# Patient Record
Sex: Male | Born: 1940 | Race: White | Hispanic: No | Marital: Married | Smoking: Light tobacco smoker
Health system: Southern US, Community
[De-identification: ages and names within clinical notes are randomized; demographics above are authoritative.]

## PROBLEM LIST (undated history)

## (undated) DIAGNOSIS — K219 Gastro-esophageal reflux disease without esophagitis: Secondary | ICD-10-CM

## (undated) DIAGNOSIS — IMO0002 Reserved for concepts with insufficient information to code with codable children: Secondary | ICD-10-CM

## (undated) DIAGNOSIS — H3320 Serous retinal detachment, unspecified eye: Secondary | ICD-10-CM

## (undated) DIAGNOSIS — K449 Diaphragmatic hernia without obstruction or gangrene: Secondary | ICD-10-CM

## (undated) DIAGNOSIS — I209 Angina pectoris, unspecified: Secondary | ICD-10-CM

## (undated) DIAGNOSIS — I714 Abdominal aortic aneurysm, without rupture, unspecified: Secondary | ICD-10-CM

## (undated) DIAGNOSIS — J449 Chronic obstructive pulmonary disease, unspecified: Secondary | ICD-10-CM

## (undated) DIAGNOSIS — IMO0001 Reserved for inherently not codable concepts without codable children: Secondary | ICD-10-CM

## (undated) DIAGNOSIS — Z951 Presence of aortocoronary bypass graft: Secondary | ICD-10-CM

## (undated) DIAGNOSIS — I2510 Atherosclerotic heart disease of native coronary artery without angina pectoris: Secondary | ICD-10-CM

## (undated) DIAGNOSIS — F329 Major depressive disorder, single episode, unspecified: Secondary | ICD-10-CM

## (undated) DIAGNOSIS — I509 Heart failure, unspecified: Secondary | ICD-10-CM

## (undated) DIAGNOSIS — F32A Depression, unspecified: Secondary | ICD-10-CM

## (undated) DIAGNOSIS — R911 Solitary pulmonary nodule: Secondary | ICD-10-CM

## (undated) DIAGNOSIS — E785 Hyperlipidemia, unspecified: Secondary | ICD-10-CM

## (undated) DIAGNOSIS — C801 Malignant (primary) neoplasm, unspecified: Secondary | ICD-10-CM

## (undated) DIAGNOSIS — H269 Unspecified cataract: Secondary | ICD-10-CM

## (undated) DIAGNOSIS — I10 Essential (primary) hypertension: Secondary | ICD-10-CM

## (undated) DIAGNOSIS — I9789 Other postprocedural complications and disorders of the circulatory system, not elsewhere classified: Secondary | ICD-10-CM

## (undated) DIAGNOSIS — I4891 Unspecified atrial fibrillation: Secondary | ICD-10-CM

## (undated) DIAGNOSIS — M1990 Unspecified osteoarthritis, unspecified site: Secondary | ICD-10-CM

## (undated) HISTORY — DX: Unspecified atrial fibrillation: I48.91

## (undated) HISTORY — PX: NOSE SURGERY: SHX723

## (undated) HISTORY — DX: Abdominal aortic aneurysm, without rupture: I71.4

## (undated) HISTORY — DX: Serous retinal detachment, unspecified eye: H33.20

## (undated) HISTORY — DX: Chronic obstructive pulmonary disease, unspecified: J44.9

## (undated) HISTORY — DX: Reserved for concepts with insufficient information to code with codable children: IMO0002

## (undated) HISTORY — PX: EYE SURGERY: SHX253

## (undated) HISTORY — DX: Reserved for inherently not codable concepts without codable children: IMO0001

## (undated) HISTORY — DX: Presence of aortocoronary bypass graft: Z95.1

## (undated) HISTORY — DX: Atherosclerotic heart disease of native coronary artery without angina pectoris: I25.10

## (undated) HISTORY — DX: Hyperlipidemia, unspecified: E78.5

## (undated) HISTORY — DX: Solitary pulmonary nodule: R91.1

## (undated) HISTORY — DX: Abdominal aortic aneurysm, without rupture, unspecified: I71.40

## (undated) HISTORY — DX: Diaphragmatic hernia without obstruction or gangrene: K44.9

## (undated) HISTORY — DX: Unspecified cataract: H26.9

## (undated) HISTORY — DX: Gastro-esophageal reflux disease without esophagitis: K21.9

## (undated) HISTORY — DX: Unspecified osteoarthritis, unspecified site: M19.90

## (undated) HISTORY — DX: Other postprocedural complications and disorders of the circulatory system, not elsewhere classified: I97.89

## (undated) HISTORY — DX: Essential (primary) hypertension: I10

---

## 1962-06-20 HISTORY — PX: LUNG SURGERY: SHX703

## 1990-06-20 HISTORY — PX: CORONARY ANGIOPLASTY: SHX604

## 2002-10-15 ENCOUNTER — Other Ambulatory Visit: Admission: RE | Admit: 2002-10-15 | Discharge: 2002-10-15 | Payer: Self-pay | Admitting: Dermatology

## 2002-10-29 ENCOUNTER — Other Ambulatory Visit: Admission: RE | Admit: 2002-10-29 | Discharge: 2002-10-29 | Payer: Self-pay | Admitting: Dermatology

## 2006-05-24 ENCOUNTER — Ambulatory Visit (HOSPITAL_COMMUNITY): Admission: RE | Admit: 2006-05-24 | Discharge: 2006-05-24 | Payer: Self-pay | Admitting: Ophthalmology

## 2006-06-06 ENCOUNTER — Ambulatory Visit (HOSPITAL_COMMUNITY): Admission: RE | Admit: 2006-06-06 | Discharge: 2006-06-07 | Payer: Self-pay | Admitting: Ophthalmology

## 2010-08-20 ENCOUNTER — Encounter (INDEPENDENT_AMBULATORY_CARE_PROVIDER_SITE_OTHER): Payer: Medicare Other | Admitting: Vascular Surgery

## 2010-08-20 ENCOUNTER — Other Ambulatory Visit (HOSPITAL_COMMUNITY): Payer: Self-pay | Admitting: Internal Medicine

## 2010-08-20 DIAGNOSIS — R911 Solitary pulmonary nodule: Secondary | ICD-10-CM

## 2010-08-20 DIAGNOSIS — I714 Abdominal aortic aneurysm, without rupture: Secondary | ICD-10-CM

## 2010-08-23 NOTE — Assessment & Plan Note (Signed)
OFFICE VISIT  DELMON, ANDRADA DOB:  [birth year]                                       08/20/2010 UXNAT#:[id number]  This is a new patient consultation from Doreen Beam, MD.  REASON FOR CONSULTATION:  Aortic aneurysm.  HISTORY OF PRESENT ILLNESS:  This is a 70 year old gentleman who presents with chief complaint of abdominal aortic aneurysm.  Apparently he had sustained a rectus hematoma for unknown reason and was having severe pain and as part of that workup obtained a CTA that demonstrated an aneurysm.  He is completely asymptomatic from his aneurysm at this point.  No abdominal pain or back pain.  He has no family history of aneurysmal disease and no connective tissue disorder within his family. His risk factors for aneurysmal disease included age, male sex, COPD, general atherosclerotic disease, hypertension and smoking.  PAST MEDICAL HISTORY:  Includes coronary artery disease, COPD, nicotine dependent, hypertension, hyperlipidemic, traumatic injury to his right chest, bilateral cataracts, history of right retinal detachment.  PAST SURGICAL HISTORY:  Included a right lung resection related to his right chest traumatic injury, laser surgery to his eyes.  He has had a coronary stent placed.  He has also had some type of nasal surgery due to trauma to his nasal septum.  SOCIAL HISTORY:  He smokes 20 packs per day for a total about 100 pack year history.  Drinks socially.  Denies any illicit drug use.  FAMILY HISTORY:  Father had an MI at 8.  He had a brother that died at 5 with coronary artery disease.  Mother died of old age in the 68s.  He had a sister that had leukemia.  MEDICATIONS:  He is on atenolol, diltiazem, lovastatin, Prilosec, Celexa, aspirin, Nitrostat.  ALLERGIES:  Are penicillin.  REVIEW OF SYSTEMS:  He noted reflux, hiatal hernia and ulcer.  PHYSICAL EXAMINATION.:  Vital signs:  Blood pressure 144/88, pulse 78, respirations were  12. General:  Well-developed, well-nourished, no apparent distress, alert and oriented x3.  Head:  Normocephalic, atraumatic. ENT:  Hearing was decreased bilaterally.  His oropharynx had some erythema without any exudate.  Nares were without any erythema or drainage. Eyes:  Pupils were equal, round and reactive.  He had postsurgical changes to his pupils bilaterally.  Neck:  Supple without any obvious nuchal rigidity. Lungs:  Exam symmetric, good air movement.  No rales, rhonchi or wheezing. Cardiac:  Regular rate and rhythm.  Normal S1-S2.  No murmurs, rubs, thrills or gallops. Vascular:  He had palpable upper extremity pulses.  For the lower extremities, I felt his popliteals and I could feel palpable posterior tibial bilaterally but no dorsalis pedis and both feet were cool.  His abdominal aorta was not palpable due to his obesity.  He had no bruits in bilateral neck. Abdomen:  Soft.  Abdomen nontender, nondistended.  No guarding, no rebound, no hepatosplenomegaly.  No obvious masses. Musculoskeletal:  He had 5/5 strength in all extremities.  There were no obvious ischemic changes in any extremity. Neuro:  Cranial nerves II-XII were intact.  Motor was as listed above. Sensation was grossly intact. Psychiatric:  Judgment was intact.  Mood and affect were appropriate for his clinical situation. Skin:  He had multiple areas of skin damage on both his hands and face. His extremities were as listed above. Lymphatic:  He had no cervical, axillary or  inguinal lymphadenopathy.  MEDICAL DECISION MAKING:  This is a 70 year old gentleman with incidentally found abdominal aortic aneurysm.  He is asymptomatic at this point.  My plan is to proceed forward with an abdominal aortic duplex as the CTA is not able to produce an orthogonal measurement of the aneurysm without 3-D reconstructions which were not available on the outside disk.  I reviewed the outside disk to my eyes and he has  an aorta that I agree maximal measurement is about 4.1 cm.  He has an acceptable neck for an endograft and acceptable iliac vessels for endograft with good distal landing zone.  I think the first step would be to get a true measurement of the size of this aneurysm with an aortic duplex and also due to some missing pulses in his feet I am a bit concerned that he may have in fact some degree of peripheral arterial disease so I am going to order bilateral lower extremity ABIs to screen for such.  This gentleman based on his aortic size most likely will be on a q.year surveillance.  The criteria for proceeding with repair include 5 to 5.5 cm diameter in the aorta, growth of 0.5 cm per 6 months or symptomatic status.  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  In most cases combined aortic and common iliac artery repair is indicated.  Thank you for giving [place] the opportunity to participate in this patient's care.  We will obtain the studies and have him follow up in about 1-2 weeks.    Fransisco Hertz, MD Electronically Signed  BLC/MEDQ  D:  08/20/2010  T:  08/23/2010  Job:  [id number]  cc:   Doreen Beam, MD

## 2010-09-03 ENCOUNTER — Encounter (INDEPENDENT_AMBULATORY_CARE_PROVIDER_SITE_OTHER): Payer: Medicare Other

## 2010-09-03 ENCOUNTER — Ambulatory Visit (INDEPENDENT_AMBULATORY_CARE_PROVIDER_SITE_OTHER): Payer: Medicare Other | Admitting: Vascular Surgery

## 2010-09-03 DIAGNOSIS — I714 Abdominal aortic aneurysm, without rupture: Secondary | ICD-10-CM

## 2010-09-03 DIAGNOSIS — R0989 Other specified symptoms and signs involving the circulatory and respiratory systems: Secondary | ICD-10-CM

## 2010-09-07 ENCOUNTER — Encounter (HOSPITAL_COMMUNITY)
Admission: RE | Admit: 2010-09-07 | Discharge: 2010-09-07 | Disposition: A | Discharge status: Home / Self Care | Payer: Medicare Other | Source: Ambulatory Visit | Attending: Internal Medicine | Admitting: Internal Medicine

## 2010-09-07 ENCOUNTER — Encounter (HOSPITAL_COMMUNITY): Payer: Self-pay

## 2010-09-07 DIAGNOSIS — R911 Solitary pulmonary nodule: Secondary | ICD-10-CM

## 2010-09-07 DIAGNOSIS — J984 Other disorders of lung: Secondary | ICD-10-CM | POA: Insufficient documentation

## 2010-09-07 HISTORY — DX: Malignant (primary) neoplasm, unspecified: C80.1

## 2010-09-07 LAB — GLUCOSE, CAPILLARY: Glucose-Capillary: 107 mg/dL — ABNORMAL HIGH (ref 70–99)

## 2010-09-07 MED ORDER — FLUDEOXYGLUCOSE F - 18 (FDG) INJECTION
17.6000 | Freq: Once | INTRAVENOUS | Status: AC | PRN
  Administered 2010-09-07: 17.6 via INTRAVENOUS

## 2010-09-07 NOTE — Assessment & Plan Note (Signed)
OFFICE VISIT  Troy [name], Troy [name] DOB:  12/28/1940                                       09/03/2010 ZHYQM#:[id number]  This is an established patient.  HISTORY OF PRESENT ILLNESS:  This is a 70 year old gentleman I have been seeing for abdominal aortic aneurysm.  To set his baseline ultrasound surveillance I sent him for an ultrasound.  Also I had problems feeling his pedal pulses on a previous visit so I had ABIs completed.  At this point he has no back or abdominal complaints.  At this point he has successfully quit smoking.  His past medical history, past surgical history, social history, family history, review of systems, medications and allergies are all unchanged except for the smoking cessation.  PHYSICAL EXAMINATION:  Today he had a blood pressure of 110/73, heart rate 74, respirations were 12.  On focused physical examination I did not feel a pulsatile midline mass in the abdomen.  His feet were warm today.  I still had problems feeling his pedal pulses.  NONINVASIVE VASCULAR IMAGING:  He had first an abdominal aortic duplex that demonstrates a distal AAA of 4.5 cm x 4.6 cm.  His right iliac was 1.9 and his left was 1.6.  He also had a second study of ABIs.  Right ABI was 1.18, on the left 1.16.  Pedals bilaterally were biphasic.  MEDICAL DECISION MAKING:  This is a 70 year old gentleman with a 4.6 cm abdominal aortic aneurysm.  It does not meet the repair criteria.  The differences in sizes could be accounted by the differences in the imaging modalities, CT versus ultrasound.  There is also a change in the size of the right common iliac artery aneurysm down to 1.9 cm from previous 2.1 cm.  The same criteria for repair are as previous: symptomatic status, greater than 5 cm to 5.5 cm size symptomatic aneurysm, or increase in size greater than 0.5 cm per 6 month period. The patient also has no evidence of peripheral arterial disease based on the  ABIs and waveform analysis.  He successfully quit smoking at this point which is the #1 risk factor for aneurysmal growth.  At this point this patient is on q.6 month interval for surveillance of his abdominal aortic aneurysm and will continue to follow until he meets criteria, if he ever meets the criteria for repair.    Fransisco Hertz, MD Electronically Signed  BLC/MEDQ  D:  09/03/2010  T:  09/06/2010  Job:  [id number]  cc:   Doreen Beam, MD

## 2010-09-14 NOTE — Procedures (Unsigned)
DUPLEX ULTRASOUND OF ABDOMINAL AORTA  INDICATION:  Abdominal aortic aneurysm and right common iliac artery aneurysm.  HISTORY: Diabetes:  No. Cardiac:  No. Hypertension:  Yes. Smoking:  Yes. Connective Tissue Disorder: Family History:  No. Previous Surgery:  No.  DUPLEX EXAM:         AP (cm)                   TRANSVERSE (cm) Proximal             2.9 cm                    3.0 cm Mid                  2.5 cm                    3.6 cm Distal               4.5 cm                    4.6 cm Right Iliac          1.9 cm                    1.9 cm Left Iliac           1.5 cm                    1.6 cm  PREVIOUS:  Date: 08/11/2010  (CT)  AP:  TRANSVERSE:  4.1  IMPRESSION: 1. Aneurysmal dilatation of the distal abdominal aorta with a maximum     diameter measuring slightly larger than the previous CT. 2. Maximum diameter of the right common iliac artery of 1.9 cm appears     stable when compared to the previous CT in which it measured 2.1     cm.  ___________________________________________ Fransisco Hertz, MD  CH/MEDQ  D:  09/07/2010  T:  09/07/2010  Job:  [id number]

## 2011-02-03 ENCOUNTER — Encounter: Payer: Self-pay | Admitting: Vascular Surgery

## 2011-02-25 ENCOUNTER — Encounter (INDEPENDENT_AMBULATORY_CARE_PROVIDER_SITE_OTHER): Payer: Medicare Other | Admitting: *Deleted

## 2011-02-25 ENCOUNTER — Ambulatory Visit (INDEPENDENT_AMBULATORY_CARE_PROVIDER_SITE_OTHER): Payer: Medicare Other | Admitting: Vascular Surgery

## 2011-02-25 ENCOUNTER — Encounter: Payer: Self-pay | Admitting: Vascular Surgery

## 2011-02-25 VITALS — BP 131/84 | HR 63 | Ht 71.0 in | Wt 178.0 lb

## 2011-02-25 DIAGNOSIS — I714 Abdominal aortic aneurysm, without rupture, unspecified: Secondary | ICD-10-CM

## 2011-02-25 NOTE — Progress Notes (Signed)
[facility]  Established Abdominal Aortic Aneurysm  History of Present Illness  The patient is a 70 y.o. male who presents with chief complaint: follow up for AAA.  Previous studies demonstrate an AAA, measuring 4.5 cm x 4.6  cm.  The patient does not have back or abdominal pain.    The patient's PMH, PSH, SH, FamHx, Med, Allergies and ROS are unchanged from 09/03/10 except for on review of systems he notes: chest/neck pain which resolves with NTG and sitting up.  He also notes occasionally smoking a few cigarettes..  Physical Examination  Filed Vitals:   02/25/11 0944  BP: 131/84  Pulse: 63   General: A&O x 3, WDWN  Pulmonary: Sym exp, good air movt, CTAB, no rales, rhonchi, & wheezing  Cardiac: RRR, Nl S1, S2, no Murmurs, rubs or gallops  Vascular: Vessel Right Left  Radial Palpable Palpable  Brachial Palpable Palpable  Carotid Palpable, without bruit Palpable, without bruit  Aorta Non-palpable N/A  Femoral Palpable Palpable  Popliteal Non-palpable Non-palpable  PT Palpable Palpable  DP Non-Palpable Non-Palpable   Gastrointestinal: soft, NTND, -G/R, - HSM, - masses, - CVAT B, no palpable pulsatile mass, mild obesity  Musculoskeletal: M/S 5/5 throughout , Extremities without ischemic changes   Neurologic: Pain and light touch intact in extremities , Motor exam as listed above  Non-Invasive Vascular Imaging  AAA Duplex (Date: 02/25/11)  Previous size: 4.5 cm x 4.6 cm (Date: 09/03/10)  Current size:  4.03 cm (difficult study due to tortuosity)  R iliac: 1.93 cm  L iliac: 1.28 cm  Medical Decision Making  The patient is a 70 y.o. male who presents with: asx AAA < 5.5 cm.   Based on this patient's exam and diagnostic studies, he needs q6 month aortic and aortoiliac duplexes.  The threshold for repair is AAA size > 5.5 cm, growth > 1 cm/yr, and symptomatic status.  He will follow up with me in 6 months, and if the ultrasound study is  equivocal we'll order a CTA of abd/pelvis at that time  I emphasized the importance of maximal medical management including strict control of blood pressure, blood glucose, and lipid levels, obtaining regular exercise, and cessation of smoking.    Thank you for allowing [place] to participate in this patient's care.  Leonides Sake, MD [facility] [place] Office: [phone] Pager: [phone]

## 2011-03-04 NOTE — Procedures (Unsigned)
DUPLEX ULTRASOUND OF ABDOMINAL AORTA  INDICATION:  Followup AAA and right CIA aneurysm.  HISTORY: Diabetes:  No. Cardiac:  No. Hypertension:  Yes. Smoking:  Yes. Connective Tissue Disorder: Family History:  No. Previous Surgery:  No.  DUPLEX EXAM:         AP (cm)                   TRANSVERSE (cm) Proximal             2.12 cm                   cm Mid                  4.03 cm                   cm Distal               2.85 cm                   2.71 cm Right Iliac          1.93 cm                   cm Left Iliac           1.28 cm                   cm  PREVIOUS:  Date:  09/03/2010  AP:  4.5  TRANSVERSE:  4.6  IMPRESSION: 1. Abdominal aortic aneurysm appears irregular and the aorta is     somewhat tortuous which makes accurate measurement difficult,     however, today's measurement of mid aorta is 4.03 cm. 2. Right iliac is stable measuring approximately 1.9 cm.  ___________________________________________ Fransisco Hertz, MD  LT/MEDQ  D:  02/25/2011  T:  02/25/2011  Job:  [id number]

## 2011-03-11 ENCOUNTER — Ambulatory Visit: Payer: Medicare Other | Admitting: Vascular Surgery

## 2011-05-16 ENCOUNTER — Encounter: Payer: Self-pay | Admitting: Cardiology

## 2011-05-16 ENCOUNTER — Encounter: Payer: Self-pay | Admitting: *Deleted

## 2011-05-16 ENCOUNTER — Ambulatory Visit (INDEPENDENT_AMBULATORY_CARE_PROVIDER_SITE_OTHER): Payer: Medicare Other | Admitting: Cardiology

## 2011-05-16 DIAGNOSIS — I714 Abdominal aortic aneurysm, without rupture: Secondary | ICD-10-CM

## 2011-05-16 DIAGNOSIS — F329 Major depressive disorder, single episode, unspecified: Secondary | ICD-10-CM

## 2011-05-16 DIAGNOSIS — Z9889 Other specified postprocedural states: Secondary | ICD-10-CM

## 2011-05-16 DIAGNOSIS — Z01810 Encounter for preprocedural cardiovascular examination: Secondary | ICD-10-CM

## 2011-05-16 DIAGNOSIS — I2510 Atherosclerotic heart disease of native coronary artery without angina pectoris: Secondary | ICD-10-CM

## 2011-05-16 DIAGNOSIS — Z720 Tobacco use: Secondary | ICD-10-CM

## 2011-05-16 DIAGNOSIS — I452 Bifascicular block: Secondary | ICD-10-CM | POA: Insufficient documentation

## 2011-05-16 DIAGNOSIS — I10 Essential (primary) hypertension: Secondary | ICD-10-CM

## 2011-05-16 DIAGNOSIS — R079 Chest pain, unspecified: Secondary | ICD-10-CM

## 2011-05-16 MED ORDER — NITROGLYCERIN 0.4 MG SL SUBL: 0.4000 mg | SUBLINGUAL_TABLET | SUBLINGUAL | Status: DC | PRN

## 2011-05-16 MED ORDER — ISOSORBIDE MONONITRATE ER 30 MG PO TB24: 30.0000 mg | ORAL_TABLET | Freq: Every day | ORAL | Status: DC

## 2011-05-16 NOTE — Assessment & Plan Note (Signed)
Unclear if this is an old or new finding

## 2011-05-16 NOTE — Assessment & Plan Note (Signed)
Blood pressure well controlled

## 2011-05-16 NOTE — Assessment & Plan Note (Signed)
Patient is followed by vascular surgery and receives routine followup both for AAA and peripheral vascular disease.

## 2011-05-16 NOTE — Patient Instructions (Signed)
   JV Cath - see info sheet given  Begin Imdur 30mg  daily  Nitroglycerin refill also sent to pharmacy Follow up will be given at time of discharge from procedure above

## 2011-05-16 NOTE — Assessment & Plan Note (Signed)
Patient does receive routine followup with CT scans.

## 2011-05-16 NOTE — Assessment & Plan Note (Signed)
Patient symptoms are consistent with angina. He has known significant vascular disease and prior coronary intervention approximately 20 years ago. He also has mild inferior hypokinesis by echocardiogram. I recommended a diagnostic cardiac catheterization to be done from the arm. I discussed the risks and benefits of the procedure with the patient's as well as his wife. They are in agreement to proceed. The patient has no IVP dye allergy. His only allergies penicillin. In the meanwhile I also gave him a new prescription for sublingual nitroglycerin, as well as the addition of isosorbide mononitrate 30 minutes by mouth daily

## 2011-05-16 NOTE — Assessment & Plan Note (Signed)
I counseled the patient regarding tobacco use.

## 2011-05-16 NOTE — Progress Notes (Addendum)
CC: New Patient evaluation for chest pain  HPI:  The patient is a 70-year-old male with a history of coronary artery disease status post angioplasty 20 years ago at [facility]. The patient more recently is followed by vascular surgery for a AAA which has not reached surgical dimensions yet. [4.5 x 4.6 cm]. The patient is also evaluated for peripheral vascular disease with stable ABIs. The patient has multiple cardiovascular risk factors including tobacco use, known vascular disease, hypertension and a family history of heart disease. The patient has had no further cardiac workup over the last 20 years since his original cardiac catheterization although he thinks that around that time he had another catheterization at [facility]. I do not have those results. He now reports over the last several weeks substernal chest pain although his symptoms manifested itself predominantly by pain in the right arm radiating into the right neck area associated with shortness of breath. The patient states that he had a particularly bad episode last Tuesday. The episode lasted 10-15 minutes but quickly resolved with sublingual nitroglycerin. He states that he had several other episodes in the course of the month all occurring at night causing him to take nitroglycerin and sit up at the bedside associated with dyspnea. Interestingly upon exertion he does not report any right arm or neck pain. He does have decreased exercise tolerance. He denies any of the patient's, presyncope or syncope.     PMH: reviewed and listed in Problem List in Electronic Records (and see below) Past Medical History  Diagnosis Date  .  lung nodule requiring with partial lung resection in 1964 reportedly benign still receiving ongoing followup    . COPD (chronic obstructive pulmonary disease)   . Hypertension   . Hyperlipidemia   . Ulcer   . Hiatal hernia   . Reflux   . AAA (abdominal aortic aneurysm) 4.5 x  4.6 cm closely followed  Peripheral vascular disease    . CAD (coronary artery disease) Status post angioplasty 20 years ago at [facility]    . Bilateral cataracts   . Retinal detachment     history of right detachment     Allergies/SH/FHX : available in Electronic Records for review History   Social History  . Marital Status: Married    Spouse Name: N/A    Number of Children: N/A  . Years of Education: N/A   Occupational History  . Not on file.   Social History Main Topics  . Smoking status: Current Everyday Smoker -- 1.5 packs/day for 56 years    Types: Cigarettes  . Smokeless tobacco: Never Used  . Alcohol Use: No  . Drug Use: No  . Sexually Active: Not on file   Other Topics Concern  . Not on file         Family History  Problem Relation Age of Onset  . Heart disease in father and brother  Father     Sister     Brother     Medications: Current Outpatient Prescriptions  Medication Sig Dispense Refill  . aspirin 81 MG tablet Take 81 mg by mouth daily.        . atenolol (TENORMIN) 50 MG tablet Take 50 mg by mouth daily.        . citalopram (CELEXA) 20 MG tablet Take 20 mg by mouth daily.        . diltiazem (CARDIZEM CD) 180 MG 24 hr capsule Take 180 mg by mouth   daily.        . lovastatin (MEVACOR) 40 MG tablet Take 40 mg by mouth at bedtime.        . nitroGLYCERIN (NITROSTAT) 0.4 MG SL tablet Place 1 tablet (0.4 mg total) under the tongue every 5 (five) minutes as needed.  25 tablet  3  . Omeprazole 20 MG TBEC Take 20 mg by mouth daily.        . isosorbide mononitrate (IMDUR) 30 MG 24 hr tablet Take 1 tablet (30 mg total) by mouth daily.  30 tablet  6    ROS: No nausea or vomiting. No fever or chills.No melena or hematochezia.No bleeding.No claudication  Physical Exam: BP 137/89  Pulse 64  Ht 5' 11" (1.803 m)  Wt 173 lb (78.472 kg)  BMI 24.13 kg/m2 General: White male in no distress, leathery appearing skin, in no distress Neck:  Normal carotid upstroke. No carotid bruits. No thyromegaly nonnodular thyroid. EOMI. PERRLA. The JVP is 5 cm Lungs: Somewhat diminished breath sounds, with large semicircular scar right-sided chest/back. No wheezing Cardiac: Regular rate and rhythm with normal S1-S2. No S3 or S4. No pathological murmurs Vascular: No edema. palpable posterior tibial pulses bilaterally Skin: Warm and dry Psychologic: Normal affect Neurologic: Alert and oriented x3. Grossly no focal findings  12lead ECG: Normal sinus rhythm with right bundle branch block. Left anterior hemiblock.  Limited bedside ECHO: Ejection fraction 65% with mild inferior hypokinesis. Mild aortic and mild mitral regurgitation   Assessment and Plan    

## 2011-05-18 ENCOUNTER — Telehealth: Payer: Self-pay | Admitting: *Deleted

## 2011-05-18 NOTE — Telephone Encounter (Signed)
JV Cath - scheduled for Friday, 11/30 with Riley Kill

## 2011-05-19 ENCOUNTER — Other Ambulatory Visit: Payer: Self-pay | Admitting: Cardiology

## 2011-05-19 ENCOUNTER — Telehealth: Payer: Self-pay | Admitting: *Deleted

## 2011-05-19 DIAGNOSIS — I2510 Atherosclerotic heart disease of native coronary artery without angina pectoris: Secondary | ICD-10-CM

## 2011-05-19 MED ORDER — SODIUM CHLORIDE 0.9 % IJ SOLN: 3.0000 mL | Freq: Two times a day (BID) | INTRAMUSCULAR | Status: DC

## 2011-05-19 MED ORDER — SODIUM CHLORIDE 0.9 % IJ SOLN: 3.0000 mL | INTRAMUSCULAR | Status: DC | PRN

## 2011-05-19 MED ORDER — SODIUM CHLORIDE 0.9 % IV SOLN: 250.0000 mL | INTRAVENOUS | Status: DC | PRN

## 2011-05-19 NOTE — Telephone Encounter (Signed)
No precert required for Medicare Primary and UHC secondary

## 2011-05-19 NOTE — Telephone Encounter (Signed)
Patient recently started on Isosorbide 30mg  daily on Monday, 11/26.  Took on 11/27 & 11/28 - caused really bad headache both days, along with vomiting on second day.  Headache kept him up at night also.  Has not had any today & is feeling much better.   Discussed above with GD - stop Imdur.    Heart cath as planned for tomorrow.

## 2011-05-20 ENCOUNTER — Encounter (HOSPITAL_BASED_OUTPATIENT_CLINIC_OR_DEPARTMENT_OTHER)
Admission: RE | Disposition: A | Discharge status: Hospice | Payer: Self-pay | Source: Ambulatory Visit | Attending: Cardiology

## 2011-05-20 ENCOUNTER — Other Ambulatory Visit: Payer: Self-pay

## 2011-05-20 ENCOUNTER — Inpatient Hospital Stay (HOSPITAL_COMMUNITY): Payer: Medicare Other

## 2011-05-20 ENCOUNTER — Encounter (HOSPITAL_COMMUNITY): Payer: Self-pay | Admitting: Nurse Practitioner

## 2011-05-20 ENCOUNTER — Inpatient Hospital Stay (HOSPITAL_BASED_OUTPATIENT_CLINIC_OR_DEPARTMENT_OTHER)
Admission: RE | Admit: 2011-05-20 | Discharge: 2011-05-20 | Disposition: A | Discharge status: Hospice | Payer: Medicare Other | Source: Ambulatory Visit | Attending: Cardiology | Admitting: Cardiology

## 2011-05-20 ENCOUNTER — Encounter: Payer: Self-pay | Admitting: Cardiology

## 2011-05-20 ENCOUNTER — Encounter (HOSPITAL_BASED_OUTPATIENT_CLINIC_OR_DEPARTMENT_OTHER): Payer: Self-pay | Admitting: *Deleted

## 2011-05-20 ENCOUNTER — Inpatient Hospital Stay (HOSPITAL_COMMUNITY)
Admission: AD | Admit: 2011-05-20 | Discharge: 2011-05-29 | DRG: 234 | Disposition: A | Discharge status: Home / Self Care | Payer: Medicare Other | Source: Ambulatory Visit | Attending: Thoracic Surgery (Cardiothoracic Vascular Surgery) | Admitting: Thoracic Surgery (Cardiothoracic Vascular Surgery)

## 2011-05-20 DIAGNOSIS — F3289 Other specified depressive episodes: Secondary | ICD-10-CM | POA: Diagnosis present

## 2011-05-20 DIAGNOSIS — I4891 Unspecified atrial fibrillation: Secondary | ICD-10-CM | POA: Diagnosis not present

## 2011-05-20 DIAGNOSIS — E785 Hyperlipidemia, unspecified: Secondary | ICD-10-CM | POA: Diagnosis present

## 2011-05-20 DIAGNOSIS — Z7982 Long term (current) use of aspirin: Secondary | ICD-10-CM

## 2011-05-20 DIAGNOSIS — I2510 Atherosclerotic heart disease of native coronary artery without angina pectoris: Secondary | ICD-10-CM

## 2011-05-20 DIAGNOSIS — J441 Chronic obstructive pulmonary disease with (acute) exacerbation: Secondary | ICD-10-CM | POA: Diagnosis not present

## 2011-05-20 DIAGNOSIS — E8779 Other fluid overload: Secondary | ICD-10-CM | POA: Diagnosis not present

## 2011-05-20 DIAGNOSIS — I498 Other specified cardiac arrhythmias: Secondary | ICD-10-CM | POA: Diagnosis not present

## 2011-05-20 DIAGNOSIS — K449 Diaphragmatic hernia without obstruction or gangrene: Secondary | ICD-10-CM | POA: Diagnosis present

## 2011-05-20 DIAGNOSIS — Y832 Surgical operation with anastomosis, bypass or graft as the cause of abnormal reaction of the patient, or of later complication, without mention of misadventure at the time of the procedure: Secondary | ICD-10-CM | POA: Diagnosis not present

## 2011-05-20 DIAGNOSIS — Z9861 Coronary angioplasty status: Secondary | ICD-10-CM

## 2011-05-20 DIAGNOSIS — I519 Heart disease, unspecified: Secondary | ICD-10-CM | POA: Diagnosis not present

## 2011-05-20 DIAGNOSIS — Z79899 Other long term (current) drug therapy: Secondary | ICD-10-CM

## 2011-05-20 DIAGNOSIS — Y9210 Unspecified residential institution as the place of occurrence of the external cause: Secondary | ICD-10-CM | POA: Diagnosis not present

## 2011-05-20 DIAGNOSIS — Z720 Tobacco use: Secondary | ICD-10-CM | POA: Insufficient documentation

## 2011-05-20 DIAGNOSIS — R079 Chest pain, unspecified: Secondary | ICD-10-CM | POA: Insufficient documentation

## 2011-05-20 DIAGNOSIS — F329 Major depressive disorder, single episode, unspecified: Secondary | ICD-10-CM | POA: Diagnosis present

## 2011-05-20 DIAGNOSIS — F17200 Nicotine dependence, unspecified, uncomplicated: Secondary | ICD-10-CM | POA: Diagnosis present

## 2011-05-20 DIAGNOSIS — Z880 Allergy status to penicillin: Secondary | ICD-10-CM

## 2011-05-20 DIAGNOSIS — K219 Gastro-esophageal reflux disease without esophagitis: Secondary | ICD-10-CM | POA: Diagnosis present

## 2011-05-20 DIAGNOSIS — I10 Essential (primary) hypertension: Secondary | ICD-10-CM | POA: Diagnosis present

## 2011-05-20 HISTORY — DX: Depression, unspecified: F32.A

## 2011-05-20 HISTORY — DX: Heart failure, unspecified: I50.9

## 2011-05-20 HISTORY — DX: Major depressive disorder, single episode, unspecified: F32.9

## 2011-05-20 HISTORY — DX: Angina pectoris, unspecified: I20.9

## 2011-05-20 HISTORY — DX: Gastro-esophageal reflux disease without esophagitis: K21.9

## 2011-05-20 SURGERY — JV LEFT HEART CATHETERIZATION WITH CORONARY ANGIOGRAM
Anesthesia: Moderate Sedation

## 2011-05-20 MED ORDER — ASPIRIN 81 MG PO CHEW: 324.0000 mg | CHEWABLE_TABLET | ORAL | Status: DC

## 2011-05-20 MED ORDER — ATENOLOL 50 MG PO TABS
50.0000 mg | ORAL_TABLET | Freq: Every day | ORAL | Status: DC
  Administered 2011-05-21 – 2011-05-23 (×3): 50 mg via ORAL
  Filled 2011-05-20 (×4): qty 1

## 2011-05-20 MED ORDER — ONDANSETRON HCL 4 MG/2ML IJ SOLN: 4.0000 mg | Freq: Four times a day (QID) | INTRAMUSCULAR | Status: DC | PRN

## 2011-05-20 MED ORDER — DIAZEPAM 5 MG PO TABS: 5.0000 mg | ORAL_TABLET | ORAL | Status: DC

## 2011-05-20 MED ORDER — SIMVASTATIN 40 MG PO TABS
40.0000 mg | ORAL_TABLET | Freq: Every day | ORAL | Status: DC
  Filled 2011-05-20: qty 1

## 2011-05-20 MED ORDER — ENOXAPARIN SODIUM 40 MG/0.4ML ~~LOC~~ SOLN: 40.0000 mg | SUBCUTANEOUS | Status: DC

## 2011-05-20 MED ORDER — SODIUM CHLORIDE 0.9 % IJ SOLN
3.0000 mL | Freq: Two times a day (BID) | INTRAMUSCULAR | Status: DC
  Administered 2011-05-20 – 2011-05-23 (×7): 3 mL via INTRAVENOUS

## 2011-05-20 MED ORDER — PANTOPRAZOLE SODIUM 40 MG PO TBEC: 40.0000 mg | DELAYED_RELEASE_TABLET | Freq: Every day | ORAL | Status: DC

## 2011-05-20 MED ORDER — ACETAMINOPHEN 325 MG PO TABS
650.0000 mg | ORAL_TABLET | ORAL | Status: DC | PRN
  Administered 2011-05-23: 650 mg via ORAL
  Filled 2011-05-20: qty 2

## 2011-05-20 MED ORDER — SODIUM CHLORIDE 0.9 % IV SOLN: INTRAVENOUS | Status: DC

## 2011-05-20 MED ORDER — SIMVASTATIN 5 MG PO TABS: 5.0000 mg | ORAL_TABLET | Freq: Every day | ORAL | Status: DC

## 2011-05-20 MED ORDER — NITROGLYCERIN 0.4 MG SL SUBL: 0.4000 mg | SUBLINGUAL_TABLET | SUBLINGUAL | Status: DC | PRN

## 2011-05-20 MED ORDER — SODIUM CHLORIDE 0.9 % IV SOLN: 250.0000 mL | INTRAVENOUS | Status: DC | PRN

## 2011-05-20 MED ORDER — ALBUTEROL SULFATE (5 MG/ML) 0.5% IN NEBU
2.5000 mg | INHALATION_SOLUTION | Freq: Once | RESPIRATORY_TRACT | Status: AC
  Administered 2011-05-20: 2.5 mg via RESPIRATORY_TRACT

## 2011-05-20 MED ORDER — DILTIAZEM HCL ER COATED BEADS 180 MG PO CP24
180.0000 mg | ORAL_CAPSULE | Freq: Every day | ORAL | Status: DC
  Administered 2011-05-21 – 2011-05-23 (×3): 180 mg via ORAL
  Filled 2011-05-20 (×4): qty 1

## 2011-05-20 MED ORDER — ASPIRIN 81 MG PO CHEW
81.0000 mg | CHEWABLE_TABLET | Freq: Every day | ORAL | Status: DC
  Administered 2011-05-21 – 2011-05-23 (×3): 81 mg via ORAL
  Filled 2011-05-20 (×4): qty 1

## 2011-05-20 MED ORDER — ATENOLOL 50 MG PO TABS: 50.0000 mg | ORAL_TABLET | Freq: Every day | ORAL | Status: DC

## 2011-05-20 MED ORDER — PANTOPRAZOLE SODIUM 40 MG PO TBEC
40.0000 mg | DELAYED_RELEASE_TABLET | Freq: Every day | ORAL | Status: DC
  Administered 2011-05-21 – 2011-05-23 (×3): 40 mg via ORAL
  Filled 2011-05-20 (×3): qty 1

## 2011-05-20 MED ORDER — CITALOPRAM HYDROBROMIDE 20 MG PO TABS: 20.0000 mg | ORAL_TABLET | Freq: Every day | ORAL | Status: DC

## 2011-05-20 MED ORDER — SODIUM CHLORIDE 0.9 % IJ SOLN: 3.0000 mL | INTRAMUSCULAR | Status: DC | PRN

## 2011-05-20 MED ORDER — DILTIAZEM HCL ER COATED BEADS 180 MG PO CP24: 180.0000 mg | ORAL_CAPSULE | Freq: Every day | ORAL | Status: DC

## 2011-05-20 MED ORDER — ASPIRIN 81 MG PO TABS: 81.0000 mg | ORAL_TABLET | Freq: Every day | ORAL | Status: DC

## 2011-05-20 MED ORDER — ROSUVASTATIN CALCIUM 10 MG PO TABS
10.0000 mg | ORAL_TABLET | Freq: Every day | ORAL | Status: DC
  Administered 2011-05-21 – 2011-05-23 (×3): 10 mg via ORAL
  Filled 2011-05-20 (×5): qty 1

## 2011-05-20 MED ORDER — CITALOPRAM HYDROBROMIDE 20 MG PO TABS
20.0000 mg | ORAL_TABLET | Freq: Every day | ORAL | Status: DC
  Administered 2011-05-21 – 2011-05-23 (×3): 20 mg via ORAL
  Filled 2011-05-20 (×4): qty 1

## 2011-05-20 NOTE — Progress Notes (Signed)
Patient is doing well.  No chest pain.  Has discussed with Dr. Dorris Fetch.  Plan for CABG on Tues.    Groin stable.  Shawnie Pons 05/20/2011 6:12 PM

## 2011-05-20 NOTE — Plan of Care (Signed)
Problem: Phase I Progression Outcomes Goal: Point person for discharge identified Outcome: Completed/Met Date Met:  05/20/11 Pt's wife

## 2011-05-20 NOTE — OR Nursing (Signed)
Tegaderm dressing applied, site intact, bedrest begins at 0850 

## 2011-05-20 NOTE — OR Nursing (Signed)
Report called to [place], RN on 3700, pt transported via stretcher on monitor to 3735 without change.

## 2011-05-20 NOTE — Progress Notes (Signed)
*  PRELIMINARY RESULTS*  Pre CABG upper extremity has been performed. Palmar arch evaluation= Right waveform remains normal with radial compression and decreased >50% with ulnar compression. Left waveform decreased >50% with radial and ulnar compression.   Troy [name] 05/20/2011, 4:27 PM

## 2011-05-20 NOTE — OR Nursing (Signed)
Dr Stuckey at bedside to discuss results and treatment plan with pt and family 

## 2011-05-20 NOTE — Interval H&P Note (Signed)
History and Physical Interval Note:  05/20/2011 7:38 AM  Troy [name]  has presented today for surgery, with the diagnosis of cheast pain  The various methods of treatment have been discussed with the patient and family. After consideration of risks, benefits and other options for treatment, the patient has consented to  Procedure(s): JV LEFT HEART CATHETERIZATION WITH CORONARY ANGIOGRAM as a surgical intervention .  The patients' history has been reviewed, patient examined, no change in status, stable for surgery.  I have reviewed the patients' chart and labs.  Questions were answered to the patient's satisfaction.  I examined the patient and discussed this with him in detail.  His R radial pulse is slightly weak and intermittent.  He has a good left radial site, and both femorals are stable.  Procedural approaches were evaluated, and cath sites reviewed.  [place] studies reviewed from [facility].  Labs are in order for cath procedure.  Risks reviewed.  TS     Shawnie Pons

## 2011-05-20 NOTE — Consult Note (Signed)
Reason for Consult:Left main and 3 vessel CAD Referring Physician: Dr. Shawnie Pons  Troy [name] is an 70 y.o. male.  HPI: 70 yo WM with multiple cardiac risk factors. History of PTCA 22 years ago at [facility]. No recent cardiac events. Now having CP radiating to R arm and R jaw for the past month.Several episodes at rest. Last week had a prolonged episode ~ 15 min. Required SL NTG for relief. Saw Dr. Earnestine Leys, Echo good LV function, mild AI, MR. Recommended cath. 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 Currently pain free.  Past Medical History  Diagnosis Date  . COPD (chronic obstructive pulmonary disease)   . Hypertension   . Hyperlipidemia   . Ulcer   . Reflux   . AAA (abdominal aortic aneurysm)   . CAD (coronary artery disease)     cath 11.30.2012 - 3vd  . Bilateral cataracts   . Retinal detachment     history of right detachment  . Angina   . Cancer     SPOT ON LUNG NOT DIAGNOSTED AS CANCER KEEPING AN EYE ON IT  . CHF (congestive heart failure)   . GERD (gastroesophageal reflux disease)   . Hiatal hernia   . Depression     Past Surgical History  Procedure Date  . Lung surgery 1964  . Nose surgery   . Stint 1992    Stint placement  . Coronary angioplasty   . Eye surgery     [name] DETACHED HAD SURGERY FOR IT    Family History  Problem Relation Age of Onset  . Heart disease Father   . Heart disease Sister   . Heart disease Brother     Social History:  reports that he has been smoking Cigarettes.  He has a 84 pack-year smoking history. He quit smokeless tobacco use today. He reports that he drinks about 4.2 ounces of alcohol per week. He reports that he does not use illicit drugs.  Allergies:  Allergies  Allergen Reactions  . Isosorbide Mononitrate     SEVER HEAD ACH ; PT COULD NOT SLEEP FOR 2 DAYS  . Penicillins     Medications:  Scheduled:   . aspirin  81 mg Oral Daily  . atenolol  50 mg  Oral Daily  . citalopram  20 mg Oral Daily  . diltiazem  180 mg Oral Daily  . pantoprazole  40 mg Oral Q1200  . simvastatin  40 mg Oral q1800  . sodium chloride  3 mL Intravenous Q12H    No results found for this or any previous visit (from the past 48 hour(s)).  No results found.  Review of Systems  Constitutional: Positive for malaise/fatigue (fatigues more easily).  HENT: Negative.   Eyes: Positive for blurred vision (retinal detachment, resolved with RX).  Respiratory: Positive for cough. Negative for hemoptysis, sputum production, shortness of breath and wheezing.   Cardiovascular: Positive for chest pain. Negative for palpitations, orthopnea, claudication, leg swelling and PND.  Gastrointestinal: Negative.   Genitourinary: Negative.   Musculoskeletal: Negative.   Skin: Negative.   Neurological: Negative.   Endo/Heme/Allergies: Negative.   Psychiatric/Behavioral: Negative.    Blood pressure 122/82, pulse 63, temperature 97.8 F (36.6 C), temperature source Oral, resp. rate 18, height 5\' 11"  (1.803 m), weight 79 kg (174 lb 2.6 oz), SpO2 98.00%. Physical Exam  Vitals reviewed. Constitutional: He is oriented to person, place, and time. He appears well-developed and well-nourished. No  distress.  HENT:  Head: Normocephalic and atraumatic.  Eyes: No scleral icterus.  Neck: Normal range of motion. No JVD present. No tracheal deviation present. No thyromegaly present.       No bruits  Cardiovascular: Normal rate, regular rhythm and intact distal pulses.   Murmur (faint systolic murmur) heard.      1+ DP, PT on R 2+ DO, PT on L  Respiratory: Effort normal and breath sounds normal. He has no wheezes. He has no rales.  GI: Soft. There is no tenderness.  Musculoskeletal: Normal range of motion.  Lymphadenopathy:    He has no cervical adenopathy.  Neurological: He is alert and oriented to person, place, and time.       No focal deficits  Skin: Skin is warm and dry.    Psychiatric: He has a normal mood and affect.    Assessment/Plan: 70 yo WM with new onset unstable angina. At cath has left main and 3 vessel CAD.  CABG indicated for survival benefit and relief of symptoms.  I have discussed with the patient and his family the general nature of the procedure, need for general anesthesia,and incisions to be used. I have discussed the expected hospital stay, overall recovery and short and long term outcomes. They understand the risks of CABG include but are not limited to death, stroke, MI, DVT/PE, bleeding, possible need for transfusion, infections,other organ system dysfunction including respiratory, renal, or GI complications. He understands and accepts these risks and agrees to proceed.  Needs carotid duplex and PFT's preop  Plan OR Tues 12/4, 1st available.  Brittny Spangle C 05/20/2011, 2:24 PM

## 2011-05-20 NOTE — Op Note (Signed)
Cardiac Catheterization Procedure Note  Name: Troy [name] MRN: [id number] DOB: 08/12/1940  Procedure: Left Heart Cath, Selective Coronary Angiography, LV angiography, root aortography  Indication: Chest pain   Procedural details: The right groin was prepped, draped, and anesthetized with 1% lidocaine. Using modified Seldinger technique, a 54French sheath was introduced into the left femoral artery. Standard Judkins catheters were used for coronary angiography and left ventriculography. Catheter exchanges were performed over a guidewire. There were no immediate procedural complications. The patient was transferred to the post catheterization recovery area for further monitoring.  Procedural Findings: Hemodynamics:    AO 129/80 (101) LV 123/21   Coronary angiography: Coronary dominance: right  Left mainstem: focal 60% ostial lesion  Left anterior descending (LAD): heavily calcified 90% after diagonal.  There is also a 50% mid and 50% distal lesion.  The vessel is suitable for grafting.  Left circumflex (LCx): small caliber OM without critical disease.  Long segmental AV circ lesion of 70-80% leading to large OM2-3.  OM-2 is suitable for grafting.  Right coronary artery (RCA): Calcified and long segmental 60% lesion ostial proximal with 50% mid before acute marginal.  Left ventriculography: Left ventricular systolic function is normal, LVEF is estimated at 55-65%, there is no significant mitral regurgitation.  Proximal aortography without significant aortic regurgitation   Final Conclusions:  Preserved LV function with severe three vessel CAD including involvement of the LMCA.    Recommendations: Admission and surgical consult.  Patient and family aware.  Shawnie Pons 05/20/2011, 8:58 AM

## 2011-05-20 NOTE — H&P (View-Only) (Signed)
CC: New Patient evaluation for chest pain  HPI:  The patient is a 70 year old male with a history of coronary artery disease status post angioplasty 20 years ago at [facility]. The patient more recently is followed by vascular surgery for a AAA which has not reached surgical dimensions yet. [4.5 x 4.6 cm]. The patient is also evaluated for peripheral vascular disease with stable ABIs. The patient has multiple cardiovascular risk factors including tobacco use, known vascular disease, hypertension and a family history of heart disease. The patient has had no further cardiac workup over the last 20 years since his original cardiac catheterization although he thinks that around that time he had another catheterization at [facility]. I do not have those results. He now reports over the last several weeks substernal chest pain although his symptoms manifested itself predominantly by pain in the right arm radiating into the right neck area associated with shortness of breath. The patient states that he had a particularly bad episode last Tuesday. The episode lasted 10-15 minutes but quickly resolved with sublingual nitroglycerin. He states that he had several other episodes in the course of the month all occurring at night causing him to take nitroglycerin and sit up at the bedside associated with dyspnea. Interestingly upon exertion he does not report any right arm or neck pain. He does have decreased exercise tolerance. He denies any of the patient's, presyncope or syncope.     PMH: reviewed and listed in Problem List in Electronic Records (and see below) Past Medical History  Diagnosis Date  .  lung nodule requiring with partial lung resection in 1964 reportedly benign still receiving ongoing followup    . COPD (chronic obstructive pulmonary disease)   . Hypertension   . Hyperlipidemia   . Ulcer   . Hiatal hernia   . Reflux   . AAA (abdominal aortic aneurysm) 4.5 x  4.6 cm closely followed  Peripheral vascular disease    . CAD (coronary artery disease) Status post angioplasty 20 years ago at [facility]    . Bilateral cataracts   . Retinal detachment     history of right detachment     Allergies/SH/FHX : available in Electronic Records for review History   Social History  . Marital Status: Married    Spouse Name: N/A    Number of Children: N/A  . Years of Education: N/A   Occupational History  . Not on file.   Social History Main Topics  . Smoking status: Current Everyday Smoker -- 1.5 packs/day for 56 years    Types: Cigarettes  . Smokeless tobacco: Never Used  . Alcohol Use: No  . Drug Use: No  . Sexually Active: Not on file   Other Topics Concern  . Not on file         Family History  Problem Relation Age of Onset  . Heart disease in father and brother  Father     Sister     Brother     Medications: Current Outpatient Prescriptions  Medication Sig Dispense Refill  . aspirin 81 MG tablet Take 81 mg by mouth daily.        [name] atenolol (TENORMIN) 50 MG tablet Take 50 mg by mouth daily.        . citalopram (CELEXA) 20 MG tablet Take 20 mg by mouth daily.        [name] diltiazem (CARDIZEM CD) 180 MG 24 hr capsule Take 180 mg by mouth  daily.        . lovastatin (MEVACOR) 40 MG tablet Take 40 mg by mouth at bedtime.        . nitroGLYCERIN (NITROSTAT) 0.4 MG SL tablet Place 1 tablet (0.4 mg total) under the tongue every 5 (five) minutes as needed.  25 tablet  3  . Omeprazole 20 MG TBEC Take 20 mg by mouth daily.        . isosorbide mononitrate (IMDUR) 30 MG 24 hr tablet Take 1 tablet (30 mg total) by mouth daily.  30 tablet  6    ROS: No nausea or vomiting. No fever or chills.No melena or hematochezia.No bleeding.No claudication  Physical Exam: BP 137/89  Pulse 64  Ht 5\' 11"  (1.803 m)  Wt 173 lb (78.472 kg)  BMI 24.13 kg/m2 General: White male in no distress, leathery appearing skin, in no distress Neck:  Normal carotid upstroke. No carotid bruits. No thyromegaly nonnodular thyroid. EOMI. PERRLA. The JVP is 5 cm Lungs: Somewhat diminished breath sounds, with large semicircular scar right-sided chest/back. No wheezing Cardiac: Regular rate and rhythm with normal S1-S2. No S3 or S4. No pathological murmurs Vascular: No edema. palpable posterior tibial pulses bilaterally Skin: Warm and dry Psychologic: Normal affect Neurologic: Alert and oriented x3. Grossly no focal findings  12lead ECG: Normal sinus rhythm with right bundle branch block. Left anterior hemiblock.  Limited bedside ECHO: Ejection fraction 65% with mild inferior hypokinesis. Mild aortic and mild mitral regurgitation   Assessment and Plan

## 2011-05-21 DIAGNOSIS — Z01810 Encounter for preprocedural cardiovascular examination: Secondary | ICD-10-CM

## 2011-05-21 DIAGNOSIS — E785 Hyperlipidemia, unspecified: Secondary | ICD-10-CM

## 2011-05-21 DIAGNOSIS — I2510 Atherosclerotic heart disease of native coronary artery without angina pectoris: Secondary | ICD-10-CM

## 2011-05-21 LAB — BASIC METABOLIC PANEL
Calcium: 8.6 mg/dL (ref 8.4–10.5)
Chloride: 96 mEq/L (ref 96–112)
Creatinine, Ser: 0.8 mg/dL (ref 0.50–1.35)
GFR calc Af Amer: >90 mL/min (ref >90)

## 2011-05-21 LAB — LIPID PANEL: Cholesterol: 177 mg/dL (ref 0–200)

## 2011-05-21 LAB — CBC
MCH: 33.3 pg (ref 26.0–34.0)
MCV: 96.6 fL (ref 78.0–100.0)
Platelets: 234 10*3/uL (ref 150–400)
RDW: 12.8 % (ref 11.5–15.5)
WBC: 4.9 10*3/uL (ref 4.0–10.5)

## 2011-05-21 NOTE — Progress Notes (Signed)
Bilateral carotid artery duplex completed at 08:45.  Preliminary report is no evidence of significant ICA stenosis.  Palpable pedal pulses x 4. Smiley Houseman 05/21/2011, 2:37 PM

## 2011-05-21 NOTE — Progress Notes (Signed)
Subjective: No chest pain or SOB. Objective: Filed Vitals:   05/20/11 1100 05/20/11 1118 05/20/11 2200 05/21/11 0542  BP: 128/87 122/82 151/90 156/89  Pulse: 65 63 63 62  Temp: 97.8 F (36.6 C) 97.8 F (36.6 C) 97.5 F (36.4 C) 97.9 F (36.6 C)  TempSrc: Oral Oral Oral Oral  Resp: 20 18 16 18   Height: 5\' 11"  (1.803 m) 5\' 11"  (1.803 m)    Weight: 174 lb 2.6 oz (79 kg) 174 lb 2.6 oz (79 kg)  172 lb 8 oz (78.245 kg)  SpO2: 98% 98% 98% 97%   Weight change:   Intake/Output Summary (Last 24 hours) at 05/21/11 1030 Last data filed at 05/20/11 1700  Gross per 24 hour  Intake    400 ml  Output    400 ml  Net      0 ml    General: Alert, awake, oriented x3, in no acute distress.   Heart: Regular rate and rhythm, without murmurs, rubs, gallops.  Lung:  CTA Ext:  No edema.  Lab Results: Results for orders placed during the hospital encounter of 05/20/11 (from the past 24 hour(s))  CBC     Status: Abnormal   Collection Time   05/21/11  5:00 AM      Component Value Range   WBC 4.9  4.0 - 10.5 (K/uL)   RBC 3.51 (*) 4.22 - 5.81 (MIL/uL)   Hemoglobin 11.7 (*) 13.0 - 17.0 (g/dL)   HCT 16.1 (*) 09.6 - 52.0 (%)   MCV 96.6  78.0 - 100.0 (fL)   MCH 33.3  26.0 - 34.0 (pg)   MCHC 34.5  30.0 - 36.0 (g/dL)   RDW 04.5  40.9 - 81.1 (%)   Platelets 234  150 - 400 (K/uL)  BASIC METABOLIC PANEL     Status: Abnormal   Collection Time   05/21/11  5:00 AM      Component Value Range   Sodium 132 (*) 135 - 145 (mEq/L)   Potassium 4.0  3.5 - 5.1 (mEq/L)   Chloride 96  96 - 112 (mEq/L)   CO2 28  19 - 32 (mEq/L)   Glucose, Bld 96  70 - 99 (mg/dL)   BUN 8  6 - 23 (mg/dL)   Creatinine, Ser 9.14  0.50 - 1.35 (mg/dL)   Calcium 8.6  8.4 - 78.2 (mg/dL)   GFR calc non Af Amer 88 (*) >90 (mL/min)   GFR calc Af Amer >90  >90 (mL/min)  LIPID PANEL     Status: Normal   Collection Time   05/21/11  5:00 AM      Component Value Range   Cholesterol 177  0 - 200 (mg/dL)   Triglycerides 83  <956 (mg/dL)   HDL 84  >21 (mg/dL)   Total CHOL/HDL Ratio 2.1     VLDL 17  0 - 40 (mg/dL)   LDL Cholesterol 76  0 - 99 (mg/dL)    Studies/Results: No results found.  Medications: I have reviewed the patient's current medications.   Patient Active Hospital Problem List: Coronary artery disease (05/16/2011)   Assessment: Asymptomatic.   Plan: For CABG on Tuesday Hypertension (05/16/2011)   Assessment: BP is a little labile  FOllow for now.   Tobacco abuse (05/16/2011)   Assessment: Counsel.    Dyslipidemia:  Continue crestor.   LOS: 1 day   Dietrich Pates 05/21/2011, 10:30 AM

## 2011-05-22 NOTE — Progress Notes (Signed)
Subjective: No chest pain or SOB. Objective: Filed Vitals:   05/21/11 0542 05/21/11 1427 05/21/11 2200 05/22/11 0532  BP: 156/89 149/89 120/77 134/85  Pulse: 62 61 66 64  Temp: 97.9 F (36.6 C) 97.6 F (36.4 C) 98.1 F (36.7 C) 97.8 F (36.6 C)  TempSrc: Oral Oral Oral Oral  Resp: 18 16 14 18   Height:      Weight: 172 lb 8 oz (78.245 kg)     SpO2: 97% 98% 96% 98%   Weight change:  No intake or output data in the 24 hours ending 05/22/11 1010  Lung:  CTA Cardia:  RRR   Normal S1, S2.  No S3.  No murmurs Ext:  No edema Lab Results: No results found for this or any previous visit (from the past 24 hour(s)).  Studies/Results: No results found.  Medications: I have reviewed the patient's current medications.   Patient Active Hospital Problem List: Coronary artery disease (05/16/2011)   Assessment: Asymptomatic.  CABG on Tuesday.   Plan:  Hypertension (05/16/2011)   Assessment: Follow for now.  Will address post op   Plan: Dyslipidemia (05/21/2011)   Assessment: Continue meds.   Plan:   LOS: 2 days   Dietrich Pates 05/22/2011, 10:10 AM

## 2011-05-23 ENCOUNTER — Inpatient Hospital Stay (HOSPITAL_COMMUNITY): Payer: Medicare Other

## 2011-05-23 DIAGNOSIS — I2510 Atherosclerotic heart disease of native coronary artery without angina pectoris: Secondary | ICD-10-CM

## 2011-05-23 LAB — BLOOD GAS, ARTERIAL
Acid-base deficit: 0.9 mmol/L (ref 0.0–2.0)
O2 Saturation: 95.8 %
Patient temperature: 98.6
TCO2: 23.8 mmol/L (ref 0–100)
pH, Arterial: 7.439 (ref 7.350–7.450)

## 2011-05-23 LAB — TYPE AND SCREEN
ABO/RH(D): B POS
Antibody Screen: NEGATIVE

## 2011-05-23 LAB — URINALYSIS, ROUTINE W REFLEX MICROSCOPIC
Bilirubin Urine: NEGATIVE
Nitrite: NEGATIVE
Specific Gravity, Urine: 1.012 (ref 1.005–1.030)
Urobilinogen, UA: 1 mg/dL (ref 0.0–1.0)

## 2011-05-23 LAB — APTT: aPTT: 32 seconds (ref 24–37)

## 2011-05-23 LAB — PROTIME-INR
INR: 0.98 (ref 0.00–1.49)
Prothrombin Time: 13.2 seconds (ref 11.6–15.2)

## 2011-05-23 MED ORDER — SODIUM CHLORIDE 0.9 % IV SOLN
INTRAVENOUS | Status: DC
  Administered 2011-05-24: 0.3 [IU]/h via INTRAVENOUS
  Filled 2011-05-23: qty 1

## 2011-05-23 MED ORDER — PHENYLEPHRINE HCL 10 MG/ML IJ SOLN
30.0000 ug/min | INTRAVENOUS | Status: DC
  Administered 2011-05-24: 12 ug/min via INTRAVENOUS
  Filled 2011-05-23: qty 2

## 2011-05-23 MED ORDER — SODIUM CHLORIDE 0.9 % IV SOLN
0.1000 ug/kg/h | INTRAVENOUS | Status: DC
  Administered 2011-05-24: 0.7 ug/kg/h via INTRAVENOUS
  Filled 2011-05-23 (×2): qty 4

## 2011-05-23 MED ORDER — NITROGLYCERIN IN D5W 200-5 MCG/ML-% IV SOLN
2.0000 ug/min | INTRAVENOUS | Status: DC
  Filled 2011-05-23: qty 250

## 2011-05-23 MED ORDER — DIAZEPAM 5 MG PO TABS
5.0000 mg | ORAL_TABLET | Freq: Once | ORAL | Status: AC
  Administered 2011-05-24: 5 mg via ORAL
  Filled 2011-05-23: qty 1

## 2011-05-23 MED ORDER — PLASMA-LYTE 148 IV SOLN
INTRAVENOUS | Status: DC
  Filled 2011-05-23: qty 0.5

## 2011-05-23 MED ORDER — BISACODYL 5 MG PO TBEC
5.0000 mg | DELAYED_RELEASE_TABLET | Freq: Once | ORAL | Status: DC
  Filled 2011-05-23: qty 1

## 2011-05-23 MED ORDER — DIAZEPAM 5 MG PO TABS: 5.0000 mg | ORAL_TABLET | ORAL | Status: DC | PRN

## 2011-05-23 MED ORDER — PLASMA-LYTE 148 IV SOLN
INTRAVENOUS | Status: AC
  Administered 2011-05-24: 09:00:00
  Filled 2011-05-23: qty 0.5

## 2011-05-23 MED ORDER — POTASSIUM CHLORIDE 2 MEQ/ML IV SOLN
80.0000 meq | INTRAVENOUS | Status: DC
  Filled 2011-05-23: qty 40

## 2011-05-23 MED ORDER — PHENYLEPHRINE HCL 10 MG/ML IJ SOLN
30.0000 ug/min | INTRAVENOUS | Status: DC
  Filled 2011-05-23: qty 2

## 2011-05-23 MED ORDER — CHLORHEXIDINE GLUCONATE 4 % EX LIQD
60.0000 mL | Freq: Once | CUTANEOUS | Status: AC
  Administered 2011-05-24: 4 via TOPICAL

## 2011-05-23 MED ORDER — EPINEPHRINE HCL 1 MG/ML IJ SOLN
0.5000 ug/min | INTRAVENOUS | Status: DC
  Filled 2011-05-23 (×2): qty 4

## 2011-05-23 MED ORDER — MAGNESIUM SULFATE 50 % IJ SOLN
40.0000 meq | INTRAMUSCULAR | Status: DC
  Filled 2011-05-23: qty 10

## 2011-05-23 MED ORDER — VANCOMYCIN HCL 1000 MG IV SOLR
1250.0000 mg | INTRAVENOUS | Status: DC
  Filled 2011-05-23: qty 1250

## 2011-05-23 MED ORDER — MOXIFLOXACIN HCL IN NACL 400 MG/250ML IV SOLN
400.0000 mg | INTRAVENOUS | Status: DC
  Filled 2011-05-23: qty 250

## 2011-05-23 MED ORDER — NITROGLYCERIN IN D5W 200-5 MCG/ML-% IV SOLN: 2.0000 ug/min | INTRAVENOUS | Status: DC

## 2011-05-23 MED ORDER — DEXTROSE 5 % IV SOLN
750.0000 mg | INTRAVENOUS | Status: DC
  Filled 2011-05-23 (×2): qty 750

## 2011-05-23 MED ORDER — CHLORHEXIDINE GLUCONATE 4 % EX LIQD
60.0000 mL | Freq: Once | CUTANEOUS | Status: AC
  Administered 2011-05-23: 4 via TOPICAL

## 2011-05-23 MED ORDER — SODIUM CHLORIDE 0.9 % IV SOLN
INTRAVENOUS | Status: DC
  Filled 2011-05-23: qty 40

## 2011-05-23 MED ORDER — TEMAZEPAM 15 MG PO CAPS
15.0000 mg | ORAL_CAPSULE | Freq: Once | ORAL | Status: AC | PRN
  Administered 2011-05-23: 15 mg via ORAL
  Filled 2011-05-23: qty 1

## 2011-05-23 MED ORDER — CHLORHEXIDINE GLUCONATE 4 % EX LIQD
60.0000 mL | Freq: Once | CUTANEOUS | Status: AC
  Administered 2011-05-23: 4 via TOPICAL
  Filled 2011-05-23: qty 60

## 2011-05-23 MED ORDER — DOPAMINE-DEXTROSE 3.2-5 MG/ML-% IV SOLN
2.0000 ug/kg/min | INTRAVENOUS | Status: DC
  Filled 2011-05-23 (×2): qty 250

## 2011-05-23 NOTE — Consult Note (Signed)
Pt smokes 1 1/2 ppd and says he just relapsed 2 months ago after quitting for 4 months. Can't tell me a reason for his relapse. States he plans to quit cold [place] after D/C. Wife smokes and believes she'll go outside to smoke. Pt in action stage. Doesn't think he'll need med aids. Referred to 1-800 quit now for f/u and support. Discussed oral fixation substitutes, second hand smoke and in home smoking policy. Reviewed and gave pt Written education/contact information.

## 2011-05-23 NOTE — Progress Notes (Signed)
Subjective:  Good spirits.  Ambulatory  Objective:  Vital Signs in the last 24 hours: Temp:  [97.7 F (36.5 C)-98.4 F (36.9 C)] 98.4 F (36.9 C) (12/03 0500) Pulse Rate:  [61-73] 73  (12/03 0500) Resp:  [17-18] 18  (12/03 0500) BP: (105-126)/(71-78) 126/77 mmHg (12/03 0500) SpO2:  [96 %-100 %] 96 % (12/03 0500) Weight:  [78.2 kg (172 lb 6.4 oz)] 172 lb 6.4 oz (78.2 kg) (12/02 0900)  Intake/Output from previous day: 12/02 0701 - 12/03 0700 In: 1200 [P.O.:1200] Out: -    Physical Exam: General: Well developed, well nourished, in no acute distress. Head:  Normocephalic and atraumatic. Lungs: Clear to auscultation and percussion.  Prolonged expiration Heart: Normal S1 and S2.  No murmur, rubs or gallops.  Extremities: No clubbing or cyanosis. No edema. Neurologic: Alert and oriented x 3.    Lab Results:  Basename 05/21/11 0500  WBC 4.9  HGB 11.7*  PLT 234    Basename 05/21/11 0500  NA 132*  K 4.0  CL 96  CO2 28  GLUCOSE 96  BUN 8  CREATININE 0.80   No results found for this basename: TROPONINI:2,CK,MB:2 in the last 72 hours Hepatic Function Panel No results found for this basename: PROT,ALBUMIN,AST,ALT,ALKPHOS,BILITOT,BILIDIR,IBILI in the last 72 hours  Basename 05/21/11 0500  CHOL 177   No results found for this basename: PROTIME in the last 72 hours  Imaging: No results found.    Assessment/Plan:  Patient Active Hospital Problem List: Coronary artery disease (05/16/2011)   Assessment: Three vessel CAD including LMCA involvement   Plan: CABG tomorrow Hypertension (05/16/2011)   Assessment: Controlled   Plan: Continue current treatment Tobacco abuse (05/16/2011)   Assessment: See in H and P   Plan: Counseling provided Dyslipidemia (05/21/2011)   Assessment: See HAP   Plan: Continue meds.       Shawnie Pons, MD, [facility], FSCAI 05/23/2011, 7:50 AM

## 2011-05-23 NOTE — Progress Notes (Signed)
Procedure(s) (LRB): CORONARY ARTERY BYPASS GRAFTING (CABG) (N/A) Subjective: No c/o, quiet weekend. Denies CP, SOB  Objective: Vital signs in last 24 hours: Temp:  [97.7 F (36.5 C)-98.4 F (36.9 C)] 97.7 F (36.5 C) (12/03 1300) Pulse Rate:  [63-73] 65  (12/03 1300) Cardiac Rhythm:  [-] Normal sinus rhythm (12/03 0757) Resp:  [16-18] 16  (12/03 1300) BP: (105-126)/(70-77) 105/70 mmHg (12/03 1300) SpO2:  [96 %-97 %] 97 % (12/03 1300)  Hemodynamic parameters for last 24 hours:    Intake/Output from previous day: 12/02 0701 - 12/03 0700 In: 1200 [P.O.:1200] Out: -  Intake/Output this shift: Total I/O In: 360 [P.O.:360] Out: -   unchanged  Lab Results:  Basename 05/21/11 0500  WBC 4.9  HGB 11.7*  HCT 33.9*  PLT 234   BMET:  Basename 05/21/11 0500  NA 132*  K 4.0  CL 96  CO2 28  GLUCOSE 96  BUN 8  CREATININE 0.80  CALCIUM 8.6    PT/INR: No results found for this basename: LABPROT,INR in the last 72 hours ABG No results found for this basename: phart, pco2, po2, hco3, tco2, acidbasedef, o2sat   CBG (last 3)  No results found for this basename: GLUCAP:3 in the last 72 hours  Assessment/Plan: S/P Procedure(s) (LRB): CORONARY ARTERY BYPASS GRAFTING (CABG) (N/A) For CABG in AM. Pt aware of risks/ benefits All questions answered PFTs show moderate COPD wirh FEV! 1.9(61%) FEV1/FVC 58%, DLCO 76%   LOS: 3 days    Kista Robb C 05/23/2011

## 2011-05-24 ENCOUNTER — Encounter (HOSPITAL_COMMUNITY): Payer: Self-pay

## 2011-05-24 ENCOUNTER — Encounter (HOSPITAL_COMMUNITY)
Admission: AD | Disposition: A | Discharge status: Home / Self Care | Payer: Self-pay | Source: Ambulatory Visit | Attending: Thoracic Surgery (Cardiothoracic Vascular Surgery)

## 2011-05-24 ENCOUNTER — Inpatient Hospital Stay (HOSPITAL_COMMUNITY): Payer: Medicare Other

## 2011-05-24 DIAGNOSIS — I2510 Atherosclerotic heart disease of native coronary artery without angina pectoris: Secondary | ICD-10-CM

## 2011-05-24 HISTORY — PX: CORONARY ARTERY BYPASS GRAFT: SHX141

## 2011-05-24 LAB — CBC
HCT: 22 % — ABNORMAL LOW (ref 39.0–52.0)
Hemoglobin: 7.7 g/dL — ABNORMAL LOW (ref 13.0–17.0)
Hemoglobin: 7.7 g/dL — ABNORMAL LOW (ref 13.0–17.0)
MCH: 33.5 pg (ref 26.0–34.0)
MCHC: 35 g/dL (ref 30.0–36.0)
MCV: 95.7 fL (ref 78.0–100.0)
RBC: 2.3 MIL/uL — ABNORMAL LOW (ref 4.22–5.81)
RBC: 2.25 MIL/uL — ABNORMAL LOW (ref 4.22–5.81)

## 2011-05-24 LAB — POCT I-STAT 4, (NA,K, GLUC, HGB,HCT)
Glucose, Bld: 134 mg/dL — ABNORMAL HIGH (ref 70–99)
Glucose, Bld: 125 mg/dL — ABNORMAL HIGH (ref 70–99)
HCT: 31 % — ABNORMAL LOW (ref 39.0–52.0)
HCT: 29 % — ABNORMAL LOW (ref 39.0–52.0)
HCT: 23 % — ABNORMAL LOW (ref 39.0–52.0)
HCT: 24 % — ABNORMAL LOW (ref 39.0–52.0)
HCT: 22 % — ABNORMAL LOW (ref 39.0–52.0)
Hemoglobin: 10.5 g/dL — ABNORMAL LOW (ref 13.0–17.0)
Hemoglobin: 9.9 g/dL — ABNORMAL LOW (ref 13.0–17.0)
Hemoglobin: 8.2 g/dL — ABNORMAL LOW (ref 13.0–17.0)
Hemoglobin: 8.2 g/dL — ABNORMAL LOW (ref 13.0–17.0)
Potassium: 4.3 mEq/L (ref 3.5–5.1)
Potassium: 4.7 mEq/L (ref 3.5–5.1)
Sodium: 132 mEq/L — ABNORMAL LOW (ref 135–145)
Sodium: 135 mEq/L (ref 135–145)
Sodium: 134 mEq/L — ABNORMAL LOW (ref 135–145)
Sodium: 136 mEq/L (ref 135–145)

## 2011-05-24 LAB — GLUCOSE, CAPILLARY
Glucose-Capillary: 82 mg/dL (ref 70–99)
Glucose-Capillary: 87 mg/dL (ref 70–99)
Glucose-Capillary: 68 mg/dL — ABNORMAL LOW (ref 70–99)
Glucose-Capillary: 90 mg/dL (ref 70–99)
Glucose-Capillary: 154 mg/dL — ABNORMAL HIGH (ref 70–99)

## 2011-05-24 LAB — POCT I-STAT 3, ART BLOOD GAS (G3+)
Acid-base deficit: 4 mmol/L — ABNORMAL HIGH (ref 0.0–2.0)
Acid-base deficit: 5 mmol/L — ABNORMAL HIGH (ref 0.0–2.0)
Bicarbonate: 22.7 mEq/L (ref 20.0–24.0)
O2 Saturation: 100 %
Patient temperature: 37
TCO2: 26 mmol/L (ref 0–100)
TCO2: 24 mmol/L (ref 0–100)
TCO2: 25 mmol/L (ref 0–100)
pCO2 arterial: 45.3 mmHg — ABNORMAL HIGH (ref 35.0–45.0)
pH, Arterial: 7.323 — ABNORMAL LOW (ref 7.350–7.450)
pO2, Arterial: 342 mmHg — ABNORMAL HIGH (ref 80.0–100.0)
pO2, Arterial: 84 mmHg (ref 80.0–100.0)

## 2011-05-24 LAB — CREATININE, SERUM
Creatinine, Ser: 0.66 mg/dL (ref 0.50–1.35)
GFR calc Af Amer: >90 mL/min (ref >90)
GFR calc non Af Amer: >90 mL/min (ref >90)

## 2011-05-24 LAB — POCT I-STAT, CHEM 8
BUN: 7 mg/dL (ref 6–23)
Calcium, Ion: 1.19 mmol/L (ref 1.12–1.32)
Chloride: 102 mEq/L (ref 96–112)
HCT: 37 % — ABNORMAL LOW (ref 39.0–52.0)
Potassium: 4.2 mEq/L (ref 3.5–5.1)
Sodium: 136 mEq/L (ref 135–145)

## 2011-05-24 LAB — HEMOGLOBIN AND HEMATOCRIT, BLOOD
HCT: 22.7 % — ABNORMAL LOW (ref 39.0–52.0)
Hemoglobin: 8 g/dL — ABNORMAL LOW (ref 13.0–17.0)

## 2011-05-24 LAB — MAGNESIUM: Magnesium: 2.5 mg/dL (ref 1.5–2.5)

## 2011-05-24 SURGERY — CORONARY ARTERY BYPASS GRAFTING (CABG)
Anesthesia: General | Site: Chest

## 2011-05-24 MED ORDER — NITROGLYCERIN IN D5W 200-5 MCG/ML-% IV SOLN: 0.0000 ug/min | INTRAVENOUS | Status: DC

## 2011-05-24 MED ORDER — VANCOMYCIN HCL 1000 MG IV SOLR
1000.0000 mg | INTRAVENOUS | Status: DC | PRN
  Administered 2011-05-24: 1250 mg via INTRAVENOUS

## 2011-05-24 MED ORDER — METOPROLOL TARTRATE 12.5 MG HALF TABLET
12.5000 mg | ORAL_TABLET | Freq: Two times a day (BID) | ORAL | Status: DC
  Administered 2011-05-25: 12.5 mg via ORAL
  Filled 2011-05-24 (×5): qty 1

## 2011-05-24 MED ORDER — FAMOTIDINE IN NACL 20-0.9 MG/50ML-% IV SOLN
20.0000 mg | Freq: Two times a day (BID) | INTRAVENOUS | Status: AC
  Administered 2011-05-24: 20 mg via INTRAVENOUS

## 2011-05-24 MED ORDER — ACETAMINOPHEN 650 MG RE SUPP
650.0000 mg | RECTAL | Status: AC
  Administered 2011-05-24: 650 mg via RECTAL

## 2011-05-24 MED ORDER — ACETAMINOPHEN 160 MG/5ML PO SOLN
975.0000 mg | Freq: Four times a day (QID) | ORAL | Status: DC
  Filled 2011-05-24: qty 40.6

## 2011-05-24 MED ORDER — PROPOFOL 10 MG/ML IV EMUL
INTRAVENOUS | Status: DC | PRN
  Administered 2011-05-24: 60 mg via INTRAVENOUS

## 2011-05-24 MED ORDER — MIDAZOLAM HCL 5 MG/5ML IJ SOLN
INTRAMUSCULAR | Status: DC | PRN
  Administered 2011-05-24 (×2): 3 mg via INTRAVENOUS
  Administered 2011-05-24 (×3): 1 mg via INTRAVENOUS
  Administered 2011-05-24 (×2): 2 mg via INTRAVENOUS

## 2011-05-24 MED ORDER — ASPIRIN 81 MG PO CHEW: 324.0000 mg | CHEWABLE_TABLET | Freq: Every day | ORAL | Status: DC

## 2011-05-24 MED ORDER — MIDAZOLAM HCL 2 MG/2ML IJ SOLN: 2.0000 mg | INTRAMUSCULAR | Status: DC | PRN

## 2011-05-24 MED ORDER — ACETAMINOPHEN 500 MG PO TABS
1000.0000 mg | ORAL_TABLET | Freq: Four times a day (QID) | ORAL | Status: DC
  Administered 2011-05-25 – 2011-05-26 (×6): 1000 mg via ORAL
  Filled 2011-05-24 (×10): qty 2

## 2011-05-24 MED ORDER — SODIUM CHLORIDE 0.9 % IV SOLN
INTRAVENOUS | Status: DC
  Administered 2011-05-24: 0.3 [IU]/h via INTRAVENOUS
  Filled 2011-05-24: qty 1

## 2011-05-24 MED ORDER — ALBUMIN HUMAN 5 % IV SOLN
12.5000 g | Freq: Once | INTRAVENOUS | Status: AC
  Administered 2011-05-24: 12.5 g via INTRAVENOUS

## 2011-05-24 MED ORDER — GLYCOPYRROLATE 0.2 MG/ML IJ SOLN
INTRAMUSCULAR | Status: DC | PRN
  Administered 2011-05-24: 0.2 mg via INTRAVENOUS

## 2011-05-24 MED ORDER — SODIUM CHLORIDE 0.9 % IV SOLN
100.0000 [IU] | INTRAVENOUS | Status: DC | PRN
  Administered 2011-05-24: 1.1 [IU]/h via INTRAVENOUS

## 2011-05-24 MED ORDER — FENTANYL CITRATE 0.05 MG/ML IJ SOLN
INTRAMUSCULAR | Status: DC | PRN
  Administered 2011-05-24: 50 ug via INTRAVENOUS
  Administered 2011-05-24: 150 ug via INTRAVENOUS
  Administered 2011-05-24: 250 ug via INTRAVENOUS
  Administered 2011-05-24: 50 ug via INTRAVENOUS
  Administered 2011-05-24: 250 ug via INTRAVENOUS
  Administered 2011-05-24: 100 ug via INTRAVENOUS
  Administered 2011-05-24: 500 ug via INTRAVENOUS
  Administered 2011-05-24 (×2): 50 ug via INTRAVENOUS
  Administered 2011-05-24: 250 ug via INTRAVENOUS
  Administered 2011-05-24: 50 ug via INTRAVENOUS

## 2011-05-24 MED ORDER — VECURONIUM BROMIDE 10 MG IV SOLR
INTRAVENOUS | Status: DC | PRN
  Administered 2011-05-24 (×3): 5 mg via INTRAVENOUS

## 2011-05-24 MED ORDER — BISACODYL 10 MG RE SUPP: 10.0000 mg | Freq: Every day | RECTAL | Status: DC

## 2011-05-24 MED ORDER — METOPROLOL TARTRATE 25 MG/10 ML ORAL SUSPENSION
12.5000 mg | Freq: Two times a day (BID) | ORAL | Status: DC
  Filled 2011-05-24: qty 5

## 2011-05-24 MED ORDER — HEPARIN SODIUM (PORCINE) 1000 UNIT/ML IJ SOLN
INTRAMUSCULAR | Status: DC | PRN
  Administered 2011-05-24: 25000 [IU] via INTRAVENOUS
  Administered 2011-05-24: 2000 [IU] via INTRAVENOUS

## 2011-05-24 MED ORDER — ASPIRIN EC 325 MG PO TBEC
325.0000 mg | DELAYED_RELEASE_TABLET | Freq: Every day | ORAL | Status: DC
  Administered 2011-05-25: 325 mg via ORAL
  Filled 2011-05-24 (×2): qty 1

## 2011-05-24 MED ORDER — SODIUM CHLORIDE 0.9 % IJ SOLN
3.0000 mL | Freq: Two times a day (BID) | INTRAMUSCULAR | Status: DC
  Administered 2011-05-25: 3 mL via INTRAVENOUS

## 2011-05-24 MED ORDER — DEXMEDETOMIDINE HCL 100 MCG/ML IV SOLN
0.1000 ug/kg/h | INTRAVENOUS | Status: DC
  Administered 2011-05-24: 0.7 ug/kg/h via INTRAVENOUS
  Filled 2011-05-24: qty 2

## 2011-05-24 MED ORDER — SODIUM CHLORIDE 0.9 % IV SOLN
20.0000 mg | INTRAVENOUS | Status: DC | PRN
  Administered 2011-05-24: 10 ug/min via INTRAVENOUS

## 2011-05-24 MED ORDER — HEMOSTATIC AGENTS (NO CHARGE) OPTIME
TOPICAL | Status: DC | PRN
  Administered 2011-05-24: 1 via TOPICAL

## 2011-05-24 MED ORDER — CALCIUM CHLORIDE 10 % IV SOLN
1.0000 g | Freq: Once | INTRAVENOUS | Status: AC
  Administered 2011-05-24: 1 g via INTRAVENOUS

## 2011-05-24 MED ORDER — NITROGLYCERIN IN D5W 200-5 MCG/ML-% IV SOLN
INTRAVENOUS | Status: DC | PRN
  Administered 2011-05-24: 16.67 ug/kg/min via INTRAVENOUS

## 2011-05-24 MED ORDER — INSULIN ASPART 100 UNIT/ML ~~LOC~~ SOLN
0.0000 [IU] | SUBCUTANEOUS | Status: DC
  Administered 2011-05-24: 4 [IU] via SUBCUTANEOUS
  Administered 2011-05-25 – 2011-05-26 (×3): 2 [IU] via SUBCUTANEOUS
  Filled 2011-05-24: qty 3

## 2011-05-24 MED ORDER — SODIUM CHLORIDE 0.9 % IV SOLN
INTRAVENOUS | Status: DC | PRN
  Administered 2011-05-24: 08:00:00 via INTRAVENOUS

## 2011-05-24 MED ORDER — MORPHINE SULFATE 4 MG/ML IJ SOLN
2.0000 mg | INTRAMUSCULAR | Status: DC | PRN
  Administered 2011-05-25: 2 mg via INTRAVENOUS
  Administered 2011-05-25: 4 mg via INTRAVENOUS
  Filled 2011-05-24 (×2): qty 1

## 2011-05-24 MED ORDER — HETASTARCH-ELECTROLYTES 6 % IV SOLN
INTRAVENOUS | Status: DC | PRN
  Administered 2011-05-24: 13:00:00 via INTRAVENOUS

## 2011-05-24 MED ORDER — SODIUM CHLORIDE 0.9 % IV SOLN: 250.0000 mL | INTRAVENOUS | Status: DC

## 2011-05-24 MED ORDER — OXYCODONE HCL 5 MG PO TABS
5.0000 mg | ORAL_TABLET | ORAL | Status: DC | PRN
  Administered 2011-05-25 (×2): 10 mg via ORAL
  Administered 2011-05-25: 5 mg via ORAL
  Administered 2011-05-25 – 2011-05-26 (×2): 10 mg via ORAL
  Filled 2011-05-24: qty 2
  Filled 2011-05-24: qty 1
  Filled 2011-05-24 (×3): qty 2

## 2011-05-24 MED ORDER — SODIUM CHLORIDE 0.9 % IJ SOLN: 3.0000 mL | INTRAMUSCULAR | Status: DC | PRN

## 2011-05-24 MED ORDER — VANCOMYCIN HCL 1000 MG IV SOLR
1250.0000 mg | INTRAVENOUS | Status: DC | PRN
  Administered 2011-05-24: 1250 mg via INTRAVENOUS

## 2011-05-24 MED ORDER — PAPAVERINE HCL 30 MG/ML IJ SOLN
INTRAMUSCULAR | Status: DC | PRN
  Administered 2011-05-24: 60 mg via INTRAVENOUS

## 2011-05-24 MED ORDER — LACTATED RINGERS IV SOLN: 500.0000 mL | Freq: Once | INTRAVENOUS | Status: AC | PRN

## 2011-05-24 MED ORDER — POTASSIUM CHLORIDE 10 MEQ/50ML IV SOLN: 10.0000 meq | INTRAVENOUS | Status: AC

## 2011-05-24 MED ORDER — METOPROLOL TARTRATE 12.5 MG HALF TABLET
12.5000 mg | ORAL_TABLET | Freq: Two times a day (BID) | ORAL | Status: DC
  Filled 2011-05-24: qty 1

## 2011-05-24 MED ORDER — MOXIFLOXACIN HCL IN NACL 400 MG/250ML IV SOLN
400.0000 mg | INTRAVENOUS | Status: AC
  Administered 2011-05-25: 400 mg via INTRAVENOUS
  Filled 2011-05-24: qty 250

## 2011-05-24 MED ORDER — ALBUMIN HUMAN 5 % IV SOLN
INTRAVENOUS | Status: AC
  Filled 2011-05-24: qty 250

## 2011-05-24 MED ORDER — POTASSIUM CHLORIDE 10 MEQ/50ML IV SOLN
10.0000 meq | INTRAVENOUS | Status: AC
  Administered 2011-05-24 (×3): 10 meq via INTRAVENOUS

## 2011-05-24 MED ORDER — SODIUM CHLORIDE 0.9 % IJ SOLN
OROMUCOSAL | Status: DC | PRN
  Administered 2011-05-24 (×3): via TOPICAL

## 2011-05-24 MED ORDER — ACETAMINOPHEN 500 MG PO TABS: 1000.0000 mg | ORAL_TABLET | Freq: Four times a day (QID) | ORAL | Status: DC

## 2011-05-24 MED ORDER — PANTOPRAZOLE SODIUM 40 MG PO TBEC: 40.0000 mg | DELAYED_RELEASE_TABLET | Freq: Every day | ORAL | Status: DC

## 2011-05-24 MED ORDER — MAGNESIUM SULFATE 50 % IJ SOLN
4.0000 g | Freq: Once | INTRAVENOUS | Status: AC
  Administered 2011-05-24: 4 g via INTRAVENOUS
  Filled 2011-05-24: qty 8

## 2011-05-24 MED ORDER — LACTATED RINGERS IV SOLN
INTRAVENOUS | Status: DC | PRN
  Administered 2011-05-24: 07:00:00 via INTRAVENOUS

## 2011-05-24 MED ORDER — ALBUMIN HUMAN 5 % IV SOLN
250.0000 mL | INTRAVENOUS | Status: AC | PRN
  Administered 2011-05-24 (×4): 250 mL via INTRAVENOUS
  Filled 2011-05-24: qty 500

## 2011-05-24 MED ORDER — DOCUSATE SODIUM 100 MG PO CAPS
200.0000 mg | ORAL_CAPSULE | Freq: Every day | ORAL | Status: DC
  Administered 2011-05-25: 200 mg via ORAL
  Filled 2011-05-24: qty 2

## 2011-05-24 MED ORDER — SODIUM CHLORIDE 0.45 % IV SOLN
INTRAVENOUS | Status: DC
  Administered 2011-05-24: 20 mL/h via INTRAVENOUS
  Administered 2011-05-25: 09:00:00 via INTRAVENOUS

## 2011-05-24 MED ORDER — MOXIFLOXACIN HCL IN NACL 400 MG/250ML IV SOLN
INTRAVENOUS | Status: DC | PRN
  Administered 2011-05-24: 400 mg via INTRAVENOUS

## 2011-05-24 MED ORDER — PROTAMINE SULFATE 10 MG/ML IV SOLN
INTRAVENOUS | Status: DC | PRN
  Administered 2011-05-24: 250 mg via INTRAVENOUS

## 2011-05-24 MED ORDER — ACETAMINOPHEN 160 MG/5ML PO SOLN: 650.0000 mg | ORAL | Status: AC

## 2011-05-24 MED ORDER — SODIUM BICARBONATE 8.4 % IV SOLN
50.0000 meq | Freq: Once | INTRAVENOUS | Status: AC
  Administered 2011-05-24: 50 meq via INTRAVENOUS

## 2011-05-24 MED ORDER — MORPHINE SULFATE 2 MG/ML IJ SOLN
1.0000 mg | INTRAMUSCULAR | Status: AC | PRN
  Administered 2011-05-24 – 2011-05-25 (×2): 2 mg via INTRAVENOUS
  Filled 2011-05-24 (×3): qty 1

## 2011-05-24 MED ORDER — PHENYLEPHRINE HCL 10 MG/ML IJ SOLN
0.0000 ug/min | INTRAVENOUS | Status: DC
  Administered 2011-05-24: 12 ug/min via INTRAVENOUS
  Filled 2011-05-24: qty 2

## 2011-05-24 MED ORDER — LACTATED RINGERS IV SOLN
INTRAVENOUS | Status: DC
  Administered 2011-05-24: 20 mL/h via INTRAVENOUS

## 2011-05-24 MED ORDER — VANCOMYCIN HCL 1000 MG IV SOLR
1000.0000 mg | Freq: Once | INTRAVENOUS | Status: AC
  Administered 2011-05-24: 1000 mg via INTRAVENOUS
  Filled 2011-05-24: qty 1000

## 2011-05-24 MED ORDER — ROCURONIUM BROMIDE 100 MG/10ML IV SOLN
INTRAVENOUS | Status: DC | PRN
  Administered 2011-05-24: 50 mg via INTRAVENOUS

## 2011-05-24 MED ORDER — METOPROLOL TARTRATE 25 MG/10 ML ORAL SUSPENSION
12.5000 mg | Freq: Two times a day (BID) | ORAL | Status: DC
  Administered 2011-05-25: 12.5 mg
  Filled 2011-05-24 (×5): qty 5

## 2011-05-24 MED ORDER — SODIUM CHLORIDE 0.9 % IV SOLN
200.0000 ug | INTRAVENOUS | Status: DC | PRN
  Administered 2011-05-24: 0.3 ug/kg/h via INTRAVENOUS

## 2011-05-24 MED ORDER — METOPROLOL TARTRATE 1 MG/ML IV SOLN: 2.5000 mg | INTRAVENOUS | Status: DC | PRN

## 2011-05-24 MED ORDER — ONDANSETRON HCL 4 MG/2ML IJ SOLN: 4.0000 mg | Freq: Four times a day (QID) | INTRAMUSCULAR | Status: DC | PRN

## 2011-05-24 MED ORDER — LIDOCAINE HCL (CARDIAC) 20 MG/ML IV SOLN
INTRAVENOUS | Status: DC | PRN
  Administered 2011-05-24: 100 mg via INTRAVENOUS

## 2011-05-24 MED ORDER — SODIUM CHLORIDE 0.9 % IV SOLN
INTRAVENOUS | Status: DC | PRN
  Administered 2011-05-24: 13:00:00 via INTRAVENOUS

## 2011-05-24 MED ORDER — SODIUM CHLORIDE 0.9 % IV SOLN
INTRAVENOUS | Status: DC
  Administered 2011-05-24 – 2011-05-25 (×2): 20 mL/h via INTRAVENOUS

## 2011-05-24 MED ORDER — DEXTROSE 50 % IV SOLN
INTRAVENOUS | Status: AC
  Administered 2011-05-24: 25 mL
  Filled 2011-05-24: qty 50

## 2011-05-24 MED ORDER — SODIUM CHLORIDE 0.9 % IV SOLN
10.0000 g | INTRAVENOUS | Status: DC | PRN
  Administered 2011-05-24: 5 g/h via INTRAVENOUS

## 2011-05-24 MED ORDER — AMINOCAPROIC ACID 250 MG/ML IV SOLN
INTRAVENOUS | Status: DC | PRN
  Administered 2011-05-24: 5 g via INTRAVENOUS

## 2011-05-24 MED ORDER — ACETAMINOPHEN 160 MG/5ML PO SOLN: 975.0000 mg | Freq: Four times a day (QID) | ORAL | Status: DC

## 2011-05-24 MED ORDER — BISACODYL 5 MG PO TBEC
10.0000 mg | DELAYED_RELEASE_TABLET | Freq: Every day | ORAL | Status: DC
  Administered 2011-05-25: 10 mg via ORAL
  Filled 2011-05-24: qty 2

## 2011-05-24 MED ORDER — SODIUM CHLORIDE 0.9 % IV SOLN
100.0000 [IU] | INTRAVENOUS | Status: DC | PRN
  Administered 2011-05-24: 09:00:00 via INTRAVENOUS

## 2011-05-24 SURGICAL SUPPLY — 97 items
ADAPTER CARDIO PERF ANTE/RETRO (ADAPTER) IMPLANT
ADPR PRFSN 84XANTGRD RTRGD (ADAPTER)
APPLIER CLIP 9.375 SM OPEN (CLIP)
APR CLP SM 9.3 20 MLT OPN (CLIP)
ATTRACTOMAT 16X20 MAGNETIC DRP (DRAPES) ×2 IMPLANT
BAG DECANTER FOR FLEXI CONT (MISCELLANEOUS) ×2 IMPLANT
BANDAGE ELASTIC 4 VELCRO ST LF (GAUZE/BANDAGES/DRESSINGS) ×2 IMPLANT
BANDAGE ELASTIC 6 VELCRO ST LF (GAUZE/BANDAGES/DRESSINGS) ×2 IMPLANT
BANDAGE GAUZE ELAST BULKY 4 IN (GAUZE/BANDAGES/DRESSINGS) ×2 IMPLANT
BASKET HEART (ORDER IN 25'S) (MISCELLANEOUS) ×1
BASKET HEART (ORDER IN 25S) (MISCELLANEOUS) ×1 IMPLANT
BLADE SAW STERNAL (BLADE) ×2 IMPLANT
BLADE SURG 11 STRL SS (BLADE) IMPLANT
CANISTER SUCTION 2500CC (MISCELLANEOUS) ×2 IMPLANT
CANN PRFSN .5XCNCT 15X34-48 (MISCELLANEOUS) ×1
CANNULA GUNDRY RCSP 15FR (MISCELLANEOUS) IMPLANT
CANNULA PRFSN .5XCNCT 15X34-48 (MISCELLANEOUS) ×1 IMPLANT
CANNULA VEN 2 STAGE (MISCELLANEOUS) ×2
CANNULA VESSEL W/WING W/VALVE (CANNULA) ×1 IMPLANT
CANNULA VESSEL W/WING WO/VALVE (CANNULA) ×2 IMPLANT
CATH CPB KIT HENDRICKSON (MISCELLANEOUS) ×2 IMPLANT
CATH ROBINSON RED A/P 18FR (CATHETERS) ×4 IMPLANT
CATH THORACIC 36FR (CATHETERS) ×1 IMPLANT
CATH THORACIC 36FR RT ANG (CATHETERS) ×1 IMPLANT
CLIP APPLIE 9.375 SM OPEN (CLIP) IMPLANT
CLIP FOGARTY SPRING 6M (CLIP) ×2 IMPLANT
CLIP TI MEDIUM 24 (CLIP) IMPLANT
CLIP TI WIDE RED SMALL 24 (CLIP) ×2 IMPLANT
CLOTH BEACON ORANGE TIMEOUT ST (SAFETY) ×2 IMPLANT
CONN Y 3/8X3/8X3/8  BEN (MISCELLANEOUS)
CONN Y 3/8X3/8X3/8 BEN (MISCELLANEOUS) IMPLANT
COVER SURGICAL LIGHT HANDLE (MISCELLANEOUS) ×4 IMPLANT
CRADLE DONUT ADULT HEAD (MISCELLANEOUS) ×2 IMPLANT
DRAPE CARDIOVASCULAR INCISE (DRAPES) ×2
DRAPE SLUSH/WARMER DISC (DRAPES) ×1 IMPLANT
DRAPE SRG 135X102X78XABS (DRAPES) ×1 IMPLANT
DRSG COVADERM 4X14 (GAUZE/BANDAGES/DRESSINGS) ×2 IMPLANT
ELECT PAD GROUND ADT 9 (MISCELLANEOUS) IMPLANT
ELECT REM PT RETURN 9FT ADLT (ELECTROSURGICAL) ×4
ELECTRODE REM PT RTRN 9FT ADLT (ELECTROSURGICAL) ×2 IMPLANT
GLOVE BIO SURGEON STRL SZ 6 (GLOVE) ×4 IMPLANT
GLOVE BIO SURGEON STRL SZ 6.5 (GLOVE) ×6 IMPLANT
GLOVE BIO SURGEON STRL SZ7.5 (GLOVE) ×4 IMPLANT
GLOVE EUDERMIC 7 POWDERFREE (GLOVE) ×6 IMPLANT
GOWN PREVENTION PLUS XLARGE (GOWN DISPOSABLE) ×8 IMPLANT
GOWN STRL NON-REIN LRG LVL3 (GOWN DISPOSABLE) ×10 IMPLANT
HEMOSTAT POWDER SURGIFOAM 1G (HEMOSTASIS) ×6 IMPLANT
HEMOSTAT SURGICEL 2X14 (HEMOSTASIS) ×2 IMPLANT
INSERT FOGARTY XLG (MISCELLANEOUS) IMPLANT
KIT BASIN OR (CUSTOM PROCEDURE TRAY) ×2 IMPLANT
KIT PAIN CUSTOM (MISCELLANEOUS) IMPLANT
KIT ROOM TURNOVER OR (KITS) ×2 IMPLANT
KIT SUCTION CATH 14FR (SUCTIONS) ×4 IMPLANT
KIT VASOVIEW W/TROCAR VH 2000 (KITS) ×2 IMPLANT
MARKER GRAFT CORONARY BYPASS (MISCELLANEOUS) ×6 IMPLANT
NS IRRIG 1000ML POUR BTL (IV SOLUTION) ×10 IMPLANT
PACK OPEN HEART (CUSTOM PROCEDURE TRAY) ×2 IMPLANT
PAD ARMBOARD 7.5X6 YLW CONV (MISCELLANEOUS) ×4 IMPLANT
PENCIL BUTTON HOLSTER BLD 10FT (ELECTRODE) ×2 IMPLANT
PUNCH AORTIC ROTATE 4.0MM (MISCELLANEOUS) IMPLANT
PUNCH AORTIC ROTATE 4.5MM 8IN (MISCELLANEOUS) ×1 IMPLANT
PUNCH AORTIC ROTATE 5MM 8IN (MISCELLANEOUS) IMPLANT
SET CARDIOPLEGIA MPS 5001102 (MISCELLANEOUS) ×1 IMPLANT
SOLUTION ANTI FOG 6CC (MISCELLANEOUS) ×1 IMPLANT
SPONGE GAUZE 4X4 12PLY (GAUZE/BANDAGES/DRESSINGS) ×4 IMPLANT
SPONGE LAP 18X18 X RAY DECT (DISPOSABLE) ×2 IMPLANT
SUT MNCRL AB 4-0 PS2 18 (SUTURE) IMPLANT
SUT PROLENE 3 0 SH DA (SUTURE) ×4 IMPLANT
SUT PROLENE 4 0 RB 1 (SUTURE)
SUT PROLENE 4 0 SH DA (SUTURE) ×2 IMPLANT
SUT PROLENE 4-0 RB1 .5 CRCL 36 (SUTURE) IMPLANT
SUT PROLENE 6 0 C 1 30 (SUTURE) ×14 IMPLANT
SUT PROLENE 7 0 BV1 MDA (SUTURE) ×5 IMPLANT
SUT PROLENE 8 0 BV175 6 (SUTURE) IMPLANT
SUT SILK  1 MH (SUTURE) ×1
SUT SILK 1 MH (SUTURE) ×1 IMPLANT
SUT STEEL 6MS V (SUTURE) IMPLANT
SUT STEEL STERNAL CCS#1 18IN (SUTURE) ×1 IMPLANT
SUT STEEL SZ 6 DBL 3X14 BALL (SUTURE) ×2 IMPLANT
SUT VIC AB 1 CTX 36 (SUTURE) ×4
SUT VIC AB 1 CTX36XBRD ANBCTR (SUTURE) ×2 IMPLANT
SUT VIC AB 2-0 CT1 27 (SUTURE) ×2
SUT VIC AB 2-0 CT1 TAPERPNT 27 (SUTURE) IMPLANT
SUT VIC AB 2-0 CTX 27 (SUTURE) IMPLANT
SUT VIC AB 3-0 SH 27 (SUTURE)
SUT VIC AB 3-0 SH 27X BRD (SUTURE) IMPLANT
SUT VIC AB 3-0 X1 27 (SUTURE) ×2 IMPLANT
SUT VICRYL 4-0 PS2 18IN ABS (SUTURE) IMPLANT
SUTURE E-PAK OPEN HEART (SUTURE) ×2 IMPLANT
SYSTEM SAHARA CHEST DRAIN ATS (WOUND CARE) ×2 IMPLANT
TOWEL OR 17X24 6PK STRL BLUE (TOWEL DISPOSABLE) ×3 IMPLANT
TOWEL OR 17X26 10 PK STRL BLUE (TOWEL DISPOSABLE) ×2 IMPLANT
TRAY FOLEY IC TEMP SENS 14FR (CATHETERS) ×2 IMPLANT
TUBE FEEDING 8FR 16IN STR KANG (MISCELLANEOUS) ×1 IMPLANT
TUBING INSUFFLATION 10FT LAP (TUBING) ×2 IMPLANT
UNDERPAD 30X30 INCONTINENT (UNDERPADS AND DIAPERS) ×2 IMPLANT
WATER STERILE IRR 1000ML POUR (IV SOLUTION) ×4 IMPLANT

## 2011-05-24 NOTE — Progress Notes (Signed)
Patient ID: Troy [name], male   DOB: [birth year], 70 y.o.   MRN: [id number]  S/p CABG x 5 Stable early postop Good UO Minimal CT output Wean vent

## 2011-05-24 NOTE — Procedures (Signed)
Extubation Procedure Note  Patient Details:   Name: Troy [name] DOB: 09/02/1940 MRN: [id number]   Airway Documentation:    Evaluation  O2 sats: stable throughout Complications: No apparent complications2 Patient did tolerate procedure well. Bilateral Breath Sounds: Clear2  Yes  Clearance Coots 05/24/2011, 7:01 PM 2

## 2011-05-24 NOTE — Brief Op Note (Addendum)
05/20/2011 - 05/24/2011  11:54 AM  PATIENT:  Roseanne Reno Reveles  70 y.o. male  PRE-OPERATIVE DIAGNOSIS: Severe  CAD  POST-OPERATIVE DIAGNOSIS:  Same  PROCEDURE:  Procedure(s): CORONARY ARTERY BYPASS GRAFTING (CABG)x 5 (Lima-LAD; Seq SVG- AM-DistRCA; SVG- OM2; SVG-Diag) EVH Right Leg  SURGEON:  Surgeon(s): Loreli Slot, MD  PHYSICIAN ASSISTANT: Gershon Crane PA-C  ANESTHESIA:   general  PATIENT CONDITION:  ICU - intubated and hemodynamically stable.  PRE-OPERATIVE WEIGHT: 78kg  Complications- No known   Gershon Crane PA-C  Cross clap: 80 min CPB time: 137 min  Dictation # [id number]

## 2011-05-24 NOTE — Anesthesia Preprocedure Evaluation (Addendum)
Anesthesia Evaluation  Patient identified by MRN, date of birth, ID band Patient awake    Reviewed: Allergy & Precautions, H&P , NPO status , Patient's Chart, lab work & pertinent test results  Airway Mallampati: I TM Distance: >3 FB Neck ROM: full    Dental  (+) Edentulous Upper and Edentulous Lower   Pulmonary COPDCurrent Smoker,    Pulmonary exam normal       Cardiovascular hypertension, + angina at rest + CAD, +CHF, + PND and + DOE regular Normal Pt WITH4.6CM AAA STABLE   Neuro/Psych PSYCHIATRIC DISORDERS TIA Neuromuscular disease Residual Symptoms    GI/Hepatic Neg liver ROS, hiatal hernia, GERD-  Medicated,  Endo/Other  Negative Endocrine ROS  Renal/GU negative Renal ROS  Genitourinary negative   Musculoskeletal   Abdominal   Peds  Hematology negative hematology ROS (+)   Anesthesia Other Findings   Reproductive/Obstetrics                        Anesthesia Physical Anesthesia Plan  ASA: III  Anesthesia Plan: General   Post-op Pain Management:    Induction: Intravenous  Airway Management Planned: Oral ETT  Additional Equipment: PA Cath and Arterial line  Intra-op Plan:   Post-operative Plan: Post-operative intubation/ventilation  Informed Consent:   Dental advisory given  Plan Discussed with: CRNA, Anesthesiologist and Surgeon  Anesthesia Plan Comments:        Anesthesia Quick Evaluation

## 2011-05-24 NOTE — Anesthesia Postprocedure Evaluation (Signed)
  Anesthesia Post-op Note  Patient: Roseanne Reno Rocha  Procedure(s) Performed:  CORONARY ARTERY BYPASS GRAFTING (CABG) - Coronary Artery Bypass Graft on pump times five, utlizing left internal mammary artery and right saphenous vein harvested endoscopically   Patient Location: SICU  Anesthesia Type: General  Level of Consciousness: sedated and unresponsive  Airway and Oxygen Therapy: Patient remains intubated per anesthesia plan  Post-op Pain: none  Post-op Assessment: Post-op Vital signs reviewed, Patient's Cardiovascular Status Stable, Respiratory Function Stable, Patent Airway, No signs of Nausea or vomiting and Pain level controlled  Post-op Vital Signs: stable  Complications: No apparent anesthesia complications

## 2011-05-24 NOTE — Preoperative (Signed)
Beta Blockers   Reason not to administer Beta Blockers:pt currently on atenolol

## 2011-05-24 NOTE — Transfer of Care (Signed)
Immediate Anesthesia Transfer of Care Note  Patient: Troy [name]  Procedure(s) Performed:  CORONARY ARTERY BYPASS GRAFTING (CABG) - Coronary Artery Bypass Graft on pump times five, utlizing left internal mammary artery and right saphenous vein harvested endoscopically   Patient Location: PACU and SICU  Anesthesia Type: General  Level of Consciousness: sedated and Patient remains intubated per anesthesia plan  Airway & Oxygen Therapy: Patient remains intubated per anesthesia plan  Post-op Assessment: Post -op Vital signs reviewed and stable in SICU.  Post vital signs: Reviewed and stable  Complications: No apparent anesthesia complications

## 2011-05-24 NOTE — Anesthesia Procedure Notes (Signed)

## 2011-05-25 ENCOUNTER — Inpatient Hospital Stay (HOSPITAL_COMMUNITY): Payer: Medicare Other

## 2011-05-25 LAB — GLUCOSE, CAPILLARY
Glucose-Capillary: 117 mg/dL — ABNORMAL HIGH (ref 70–99)
Glucose-Capillary: 118 mg/dL — ABNORMAL HIGH (ref 70–99)
Glucose-Capillary: 123 mg/dL — ABNORMAL HIGH (ref 70–99)
Glucose-Capillary: 144 mg/dL — ABNORMAL HIGH (ref 70–99)
Glucose-Capillary: 153 mg/dL — ABNORMAL HIGH (ref 70–99)

## 2011-05-25 LAB — CREATININE, SERUM
Creatinine, Ser: 0.76 mg/dL (ref 0.50–1.35)
GFR calc Af Amer: >90 mL/min (ref >90)
GFR calc non Af Amer: 90 mL/min — ABNORMAL LOW (ref >90)

## 2011-05-25 LAB — BASIC METABOLIC PANEL
CO2: 23 mEq/L (ref 19–32)
Glucose, Bld: 130 mg/dL — ABNORMAL HIGH (ref 70–99)
Potassium: 4 mEq/L (ref 3.5–5.1)
Sodium: 131 mEq/L — ABNORMAL LOW (ref 135–145)

## 2011-05-25 LAB — MAGNESIUM: Magnesium: 2 mg/dL (ref 1.5–2.5)

## 2011-05-25 LAB — POCT I-STAT, CHEM 8
Chloride: 100 mEq/L (ref 96–112)
HCT: 31 % — ABNORMAL LOW (ref 39.0–52.0)
Hemoglobin: 10.5 g/dL — ABNORMAL LOW (ref 13.0–17.0)

## 2011-05-25 LAB — CBC
HCT: 22.4 % — ABNORMAL LOW (ref 39.0–52.0)
HCT: 23.8 % — ABNORMAL LOW (ref 39.0–52.0)
Hemoglobin: 7.7 g/dL — ABNORMAL LOW (ref 13.0–17.0)
Hemoglobin: 8.2 g/dL — ABNORMAL LOW (ref 13.0–17.0)
MCH: 33.3 pg (ref 26.0–34.0)
RBC: 2.31 MIL/uL — ABNORMAL LOW (ref 4.22–5.81)
RBC: 2.44 MIL/uL — ABNORMAL LOW (ref 4.22–5.81)
WBC: 7.3 10*3/uL (ref 4.0–10.5)

## 2011-05-25 MED ORDER — ENOXAPARIN SODIUM 40 MG/0.4ML ~~LOC~~ SOLN
40.0000 mg | Freq: Every day | SUBCUTANEOUS | Status: DC
  Administered 2011-05-25: 40 mg via SUBCUTANEOUS
  Filled 2011-05-25 (×2): qty 0.4

## 2011-05-25 MED ORDER — IPRATROPIUM-ALBUTEROL 18-103 MCG/ACT IN AERO
2.0000 | INHALATION_SPRAY | Freq: Four times a day (QID) | RESPIRATORY_TRACT | Status: DC
  Administered 2011-05-25 – 2011-05-28 (×11): 2 via RESPIRATORY_TRACT
  Filled 2011-05-25: qty 14.7

## 2011-05-25 MED ORDER — POTASSIUM CHLORIDE 10 MEQ/50ML IV SOLN
10.0000 meq | INTRAVENOUS | Status: AC
  Administered 2011-05-25 (×3): 10 meq via INTRAVENOUS
  Filled 2011-05-25: qty 50

## 2011-05-25 MED ORDER — FUROSEMIDE 10 MG/ML IJ SOLN
40.0000 mg | Freq: Once | INTRAMUSCULAR | Status: AC
  Administered 2011-05-25: 40 mg via INTRAVENOUS
  Filled 2011-05-25: qty 4

## 2011-05-25 MED ORDER — INSULIN ASPART 100 UNIT/ML ~~LOC~~ SOLN
0.0000 [IU] | SUBCUTANEOUS | Status: DC
  Administered 2011-05-25 (×2): 2 [IU] via SUBCUTANEOUS
  Filled 2011-05-25 (×2): qty 3

## 2011-05-25 NOTE — Op Note (Signed)
NAMEJEFFRY, VOGELSANG NO.:  [id number]  MEDICAL RECORD NO.:  [id number]  LOCATION:                                 FACILITY:  PHYSICIAN:  Salvatore Decent. Dorris Fetch, M.D.DATE OF BIRTH:  [birth year]  DATE OF PROCEDURE:  05/24/2011 DATE OF DISCHARGE:                              OPERATIVE REPORT   PREOPERATIVE DIAGNOSES:  Severe left main and three-vessel coronary artery disease with new onset angina.  POSTOPERATIVE DIAGNOSIS:  Severe left main and three-vessel coronary artery disease with new onset angina.  PROCEDURE:  Median sternotomy, extracorporeal circulation, coronary artery bypass grafting x5 (left internal mammary artery to LAD, saphenous vein graft to first diagonal, saphenous vein graft to obtuse marginal 2, sequential saphenous vein graft to acute marginal and distal right coronary), endoscopic vein harvest, right leg.  SURGEON:  Salvatore Decent. Dorris Fetch, MD  ASSISTANT:  Rowe Clack, PA-C.  ANESTHESIA:  General.  FINDINGS:  Diagonal and vein to the diagonal, fair quality; remaining conduits and targets, good quality; sternal osteoporosis.  CLINICAL NOTE:  Mr. Lightsey is a 70 year old gentleman with multiple cardiac risk factors and a history of coronary artery disease dating back over 20 years.  He recently developed new onset angina.  On cardiac catheterization, he was found to have severe left main and three-vessel disease.  He was advised to undergo coronary artery bypass grafting. The indications, risks, benefits, and alternatives were discussed in detail with the patient.  He understood and accepted the risks and agreed to proceed.  OPERATIVE NOTE:  Mr. Behrle was brought to the preop holding area on May 24, 2011.  There, the Anesthesia Service placed a Swan-Ganz catheter and arterial blood pressure monitoring line.  Intravenous antibiotics were administered.  He was taken to the operating room, anesthetized and intubated.  Foley catheter  was placed.  The chest, abdomen, and legs were prepped and draped in usual sterile fashion.  An incision was then made in the medial aspect of the right leg at the level of the knee.  The greater saphenous vein was identified.  It was harvested endoscopically from midcalf to groin.  Simultaneously, a median sternotomy was performed and the left internal mammary artery was harvested using standard technique. 2000 units of heparin was administered during the vessel harvest.  After harvesting the conduits, the remainder of the full heparin dose was given.  The pericardium was opened after confirming adequate anticoagulation with ACT measurement.  The aorta was cannulated via concentric 2-0 Ethibond pledgeted pursestring sutures.  A dual-stage venous cannula was placed via pursestring suture in the right atrial appendage.  Cardiopulmonary bypass was instituted and the patient was cooled to 32 degrees Celsius.  The coronary arteries were inspected and anastomotic sites were chosen.  The conduits were inspected and cut to length.  A foam pad was placed in the pericardium to insulate the heart and protect left phrenic nerve.  A temperature probe was placed in myocardial septum and a cardioplegic cannula was placed in the ascending aorta.  The aorta was crossclamped.  The left ventricle was emptied via aortic root vent.  Cardiac arrest then was achieved in combination of cold antegrade blood cardioplegia and  topical iced saline.  After achieving a complete diastolic arrest and adequate myocardial septal cooling, the following distal anastomoses were performed.  First, a reversed saphenous vein graft was placed sequentially to the acute marginal and distal right coronary.  There was a significant plaque in the right coronary proximally as well as at the bifurcation of the acute marginal.  The acute marginal was a 1.5-mm vessel.  The distal right coronary was 2 mm.  Side-to-side was performed  to the acute marginal and end-to-side to the distal right.  Both were done with running 7-0 Prolene sutures.  All anastomoses were probed proximally and distally at their completion to ensure patency.  At the completion of each vein graft, cardioplegia was administered to assess flow and hemostasis.  Next, a reverse saphenous vein graft was placed end-to-side to obtuse marginal 2.  This was a large dominant lateral branch.  It was a 2-mm target.  The vein graft was anastomosed end-to-side with a running 7 Prolene suture.  Again, there was excellent flow and good hemostasis.  A much smaller vein was used for the diagonal, which itself was a much smaller target.  It was a 1 mm fair quality target.  The vein was of fair quality.  End-to-side anastomosis was performed with running 7 Prolene suture.  There was satisfactory flow through the graft. Cardioplegia was administered.  There was good hemostasis.  Additional cardioplegia was administered down the aortic root.  The left internal mammary artery then was brought through a window in the pericardium.  The distal end was beveled and was anastomosed end-to-side to the distal LAD.  The mid distal LAD was a 1.5 mm target site anastomosis.  There was some plaquing just proximal to the anastomosis, but 1.5 mm probe did pass retrograde into the more proximal portion of the LAD.  The mammary was a good quality conduit.  It was anastomosed end-to-side with a running 7 Prolene suture.  At the completion of the mammary to LAD anastomosis, the bulldog clamp was briefly removed to inspect for hemostasis.  Immediate rapid septal rewarming was noted. The bulldog clamp was replaced, the mammary pedicle was tacked to the epicardial surface of the heart with 6-0 Prolene sutures.  Additional cardioplegia was administered.  The vein grafts were cut to length.  The proximal vein graft anastomoses for the right graft as well as the OM grafts were performed  to 4.5 mm punch aortotomies with running 6-0 Prolene sutures.  At the completion of the second proximal anastomosis, the patient was placed in Trendelenburg position. Lidocaine was administered.  The aortic root was de-aired.  The bulldog clamp was again removed from the left mammary artery.  After de-airing the aortic root.  The aortic crossclamp was removed.  Total crossclamp time was 80 minutes.  The bulldog clamps were then placed proximally and distally on the vein to the OM and an arteriotomy was made.  An end-to-side anastomosis was performed to the proximal anastomosis of the diagonal vein graft to the side of the OM vein with running 7-0 Prolene suture.  At the completion of the anastomosis, it was de-aired and all bulldog clamps were removed.  The patient required 2 defibrillations, first with 10 joules then followed by 20 joules, and was then in heart block for a brief period time and then in sinus rhythm thereafter.  While rewarming was completed, all proximal and distal anastomoses were inspected for hemostasis.  Epicardial pacing wires were placed on the right ventricle and  right atrium.  When the patient was rewarmed to a core temperature of 37 degrees Celsius, he was weaned from cardiopulmonary bypass on the first attempt without difficulty.  The total bypass time was 137 minutes.  Initial cardiac index was greater than 2 L/min/m2, and the patient remained hemodynamically stable throughout the postbypass period.  A test dose protamine was administered and was well tolerated.  The atrial and aortic cannulae were removed.  Remainder of the protamine was administered without incident.  The chest was irrigated with warm saline.  Hemostasis was achieved.  Pericardium was reapproximated with interrupted 3-0 silk sutures.  It came together easily without tension. A left pleural and single mediastinal chest tubes were placed in separate subcostal incisions.  The sternum was  closed with heavy gauge interrupted stainless steel wires.  The pectoralis fascia, subcutaneous tissue, and skin were closed in standard fashion.  All sponge, needle, instrument counts were correct at the end of the procedure.  The patient was taken from the operating room to the Surgical Intensive Care Unit in good condition.     Salvatore Decent Dorris Fetch, M.D.     SCH/MEDQ  D:  05/24/2011  T:  05/24/2011  Job:  [id number]

## 2011-05-25 NOTE — Clinical Documentation Improvement (Signed)
Anemia Blood Loss Clarification  THIS DOCUMENT IS NOT A PERMANENT PART OF THE MEDICAL RECORD  RESPOND TO THE THIS QUERY, FOLLOW THE INSTRUCTIONS BELOW:  1. If needed, update documentation for the patient's encounter via the notes activity.  2. Access this query again and click edit on the [employer].  3. After updating, or not, click F2 to complete all highlighted (required) fields concerning your review. Select "additional documentation in the medical record" OR "no additional documentation provided".  4. Click Sign note button.  5. The deficiency will fall out of your InBasket *Please let us know if you are not able to compete this workflow by phone or e-mail (listed below).        05/25/11   Dear Dr.Hendrickson Marton Redwood  In an effort to better capture your patient's severity of illness, reflect appropriate length of stay and utilization of resources, a review of the patient medical record has revealed the following indicators.    Possible Clinical Conditions?   " Expected Acute Blood Loss Anemia  " Acute Blood Loss Anemia  " Precipitous drop in Hematocrit  " Other Condition________________  " Cannot Clinically Determine    Supporting Information:  Signs and Symptoms (unable to ambulate, weakness, dizziness, unable to participate in care)  Diagnostics: H&H on 12/05:  7.7/22.4 Pre-OP H&H:   12.6/37.0  IV fluids / plasma expanders: Received several doses of Albumin 5% solution.   Reviewed:  no additional documentation provided  Thank You,  Marciano Sequin,  Clinical Documentation Specialist:  Pager: [phone]  [facility] [place]

## 2011-05-25 NOTE — Progress Notes (Signed)
Pt ambulated 300 feet.  Tolerated fairly well; Sats remained consistent with previous, 92-94% with 6 liters oxygen.  At times, pt breathed heavily and took 2 <30 second standing breaks.  Positioned back in bed, left side.  Pt resting.

## 2011-05-25 NOTE — Progress Notes (Signed)
1 Day Post-Op Procedure(s) (LRB): CORONARY ARTERY BYPASS GRAFTING (CABG) (N/A) Subjective: C/o incisional pain   Objective: Vital signs in last 24 hours: Temp:  [96.1 F (35.6 C)-100 F (37.8 C)] 99 F (37.2 C) (12/05 0800) Pulse Rate:  [56-94] 94  (12/05 0800) Cardiac Rhythm:  [-] Normal sinus rhythm (12/05 0800) Resp:  [10-42] 22  (12/05 0800) BP: (72-139)/(42-73) 106/60 mmHg (12/05 0800) SpO2:  [82 %-100 %] 93 % (12/05 0800) Arterial Line BP: (88-126)/(47-72) 126/62 mmHg (12/05 0800) FiO2 (%):  [6 %-51.4 %] 6 % (12/04 2100) Weight:  [84.2 kg (185 lb 10 oz)] 185 lb 10 oz (84.2 kg) (12/05 0445)  Hemodynamic parameters for last 24 hours: PAP: (30-47)/(16-31) 42/19 mmHg CO:  [3.1 L/min-5.9 L/min] 5.6 L/min CI:  [1.5 L/min/m2-2.9 L/min/m2] 2.7 L/min/m2  Intake/Output from previous day: 12/04 0701 - 12/05 0700 In: 8478.8 [P.O.:350; I.V.:4653.8; Blood:250; IV Piggyback:2300] Out: 3880 [Urine:2665; Blood:500; Chest Tube:715] Intake/Output this shift: Total I/O In: 111.3 [P.O.:60; I.V.:51.3] Out: 60 [Urine:20; Chest Tube:40]  General appearance: alert Neurologic: intact Heart: regular rate and rhythm Lungs: wheezes bilaterally Abdomen: normal findings: soft, non-tender  Lab Results:  Basename 05/25/11 0330 05/24/11 2027 05/24/11 2020  WBC 6.0 -- 5.1  HGB 7.7* 12.6* --  HCT 22.4* 37.0* --  PLT 141* -- 136*   BMET:  Basename 05/25/11 0330 05/24/11 2027  NA 131* 136  K 4.0 4.2  CL 102 102  CO2 23 --  GLUCOSE 130* 172*  BUN 9 7  CREATININE 0.71 0.70  CALCIUM 7.9* --    PT/INR:  Basename 05/24/11 1422  LABPROT 18.7*  INR 1.53*   ABG    Component Value Date/Time   PHART 7.323* 05/24/2011 2018   HCO3 23.3 05/24/2011 2018   TCO2 22 05/24/2011 2027   ACIDBASEDEF 2.0 05/24/2011 2018   O2SAT 94.0 05/24/2011 2018   CBG (last 3)   Basename 05/25/11 0725 05/25/11 0329 05/24/11 2348  GLUCAP 118* 144* 123*    Assessment/Plan: S/P Procedure(s) (LRB): CORONARY  ARTERY BYPASS GRAFTING (CABG) (N/A) Mobilize Diuresis d/c tubes/lines CV: stable, d/c swan Resp: COPD- moderately severe, wheezing- combivent Renal: diurese DVT proph: lovenox CBG well controlled   LOS: 5 days    Angela Platner C 05/25/2011

## 2011-05-25 NOTE — Progress Notes (Signed)
Patient ID: Troy [name], male   DOB: Aug 02, 1940, 70 y.o.   MRN: [id number]   Filed Vitals:   05/25/11 1720 05/25/11 1800 05/25/11 1900 05/25/11 1921  BP:   95/63   Pulse:  92 86   Temp:    99.4 F (37.4 C)  TempSrc:    Oral  Resp:  22 12   Height:      Weight:      SpO2: 95% 94% 97%     Stable day. Urine output ok BMET    Component Value Date/Time   NA 133* 05/25/2011 1638   K 5.3* 05/25/2011 1638   CL 100 05/25/2011 1638   CO2 23 05/25/2011 0330   GLUCOSE 124* 05/25/2011 1638   BUN 10 05/25/2011 1638   CREATININE 0.76 05/25/2011 1640   CALCIUM 7.9* 05/25/2011 0330   GFRNONAA 90* 05/25/2011 1640   GFRAA >90 05/25/2011 1640    CBC    Component Value Date/Time   WBC 7.3 05/25/2011 1640   RBC 2.44* 05/25/2011 1640   HGB 8.2* 05/25/2011 1640   HCT 23.8* 05/25/2011 1640   PLT 168 05/25/2011 1640   MCV 97.5 05/25/2011 1640   MCH 33.6 05/25/2011 1640   MCHC 34.5 05/25/2011 1640   RDW 13.2 05/25/2011 1640    A/P:  Stable. Continue present course.

## 2011-05-26 ENCOUNTER — Encounter (HOSPITAL_COMMUNITY): Payer: Self-pay | Admitting: Thoracic Surgery (Cardiothoracic Vascular Surgery)

## 2011-05-26 ENCOUNTER — Inpatient Hospital Stay (HOSPITAL_COMMUNITY): Payer: Medicare Other

## 2011-05-26 LAB — GLUCOSE, CAPILLARY
Glucose-Capillary: 120 mg/dL — ABNORMAL HIGH (ref 70–99)
Glucose-Capillary: 114 mg/dL — ABNORMAL HIGH (ref 70–99)
Glucose-Capillary: 102 mg/dL — ABNORMAL HIGH (ref 70–99)
Glucose-Capillary: 111 mg/dL — ABNORMAL HIGH (ref 70–99)

## 2011-05-26 LAB — CBC
HCT: 22.1 % — ABNORMAL LOW (ref 39.0–52.0)
Hemoglobin: 7.6 g/dL — ABNORMAL LOW (ref 13.0–17.0)
RDW: 13.3 % (ref 11.5–15.5)
WBC: 6.5 10*3/uL (ref 4.0–10.5)

## 2011-05-26 LAB — BASIC METABOLIC PANEL
Chloride: 100 mEq/L (ref 96–112)
Creatinine, Ser: 0.67 mg/dL (ref 0.50–1.35)
GFR calc Af Amer: >90 mL/min (ref >90)
GFR calc non Af Amer: >90 mL/min (ref >90)
Potassium: 4.1 mEq/L (ref 3.5–5.1)

## 2011-05-26 MED ORDER — AMIODARONE LOAD VIA INFUSION
150.0000 mg | Freq: Once | INTRAVENOUS | Status: AC
  Administered 2011-05-26: 150 mg via INTRAVENOUS
  Filled 2011-05-26: qty 151.2

## 2011-05-26 MED ORDER — ATENOLOL 25 MG PO TABS
25.0000 mg | ORAL_TABLET | Freq: Every day | ORAL | Status: DC
  Administered 2011-05-26: 25 mg via ORAL
  Filled 2011-05-26 (×2): qty 1

## 2011-05-26 MED ORDER — MOVING RIGHT ALONG BOOK
Freq: Once | Status: AC
  Administered 2011-05-26: 09:00:00
  Filled 2011-05-26: qty 1

## 2011-05-26 MED ORDER — PROMETHAZINE HCL 25 MG/ML IJ SOLN: 6.2500 mg | Freq: Four times a day (QID) | INTRAMUSCULAR | Status: DC | PRN

## 2011-05-26 MED ORDER — ENOXAPARIN SODIUM 40 MG/0.4ML ~~LOC~~ SOLN
40.0000 mg | SUBCUTANEOUS | Status: DC
  Administered 2011-05-26 – 2011-05-28 (×3): 40 mg via SUBCUTANEOUS
  Filled 2011-05-26 (×4): qty 0.4

## 2011-05-26 MED ORDER — MAGNESIUM HYDROXIDE 400 MG/5ML PO SUSP
30.0000 mL | Freq: Every day | ORAL | Status: DC | PRN
  Administered 2011-05-26: 30 mL via ORAL
  Filled 2011-05-26: qty 30

## 2011-05-26 MED ORDER — SODIUM CHLORIDE 0.9 % IV SOLN
250.0000 mL | INTRAVENOUS | Status: DC | PRN
  Administered 2011-05-26: 250 mL via INTRAVENOUS

## 2011-05-26 MED ORDER — POTASSIUM CHLORIDE CRYS ER 20 MEQ PO TBCR
40.0000 meq | EXTENDED_RELEASE_TABLET | Freq: Every day | ORAL | Status: DC
  Administered 2011-05-26 – 2011-05-29 (×4): 40 meq via ORAL
  Filled 2011-05-26 (×4): qty 2

## 2011-05-26 MED ORDER — BISACODYL 5 MG PO TBEC: 10.0000 mg | DELAYED_RELEASE_TABLET | Freq: Every day | ORAL | Status: DC | PRN

## 2011-05-26 MED ORDER — DOCUSATE SODIUM 100 MG PO CAPS
200.0000 mg | ORAL_CAPSULE | Freq: Every day | ORAL | Status: DC
  Administered 2011-05-26 – 2011-05-29 (×3): 200 mg via ORAL
  Filled 2011-05-26 (×3): qty 2

## 2011-05-26 MED ORDER — ALPRAZOLAM 0.25 MG PO TABS: 0.2500 mg | ORAL_TABLET | Freq: Four times a day (QID) | ORAL | Status: DC | PRN

## 2011-05-26 MED ORDER — POVIDONE-IODINE 10 % EX SOLN
1.0000 "application " | Freq: Two times a day (BID) | CUTANEOUS | Status: DC
  Administered 2011-05-26 – 2011-05-29 (×7): 1 via TOPICAL
  Filled 2011-05-26: qty 15

## 2011-05-26 MED ORDER — DEXTROSE 5 % IV SOLN
60.0000 mg/h | INTRAVENOUS | Status: AC
  Administered 2011-05-26: 60 mg/h via INTRAVENOUS
  Filled 2011-05-26 (×2): qty 9

## 2011-05-26 MED ORDER — PANTOPRAZOLE SODIUM 40 MG PO TBEC
40.0000 mg | DELAYED_RELEASE_TABLET | Freq: Every day | ORAL | Status: DC
  Administered 2011-05-26 – 2011-05-28 (×3): 40 mg via ORAL
  Filled 2011-05-26 (×3): qty 1

## 2011-05-26 MED ORDER — SIMVASTATIN 40 MG PO TABS
40.0000 mg | ORAL_TABLET | Freq: Every day | ORAL | Status: DC
  Administered 2011-05-26: 40 mg via ORAL
  Filled 2011-05-26 (×2): qty 1

## 2011-05-26 MED ORDER — DEXTROSE 5 % IV SOLN
30.0000 mg/h | INTRAVENOUS | Status: DC
  Administered 2011-05-27: 30 mg/h via INTRAVENOUS
  Filled 2011-05-26: qty 9

## 2011-05-26 MED ORDER — CITALOPRAM HYDROBROMIDE 20 MG PO TABS
20.0000 mg | ORAL_TABLET | Freq: Every day | ORAL | Status: DC
  Administered 2011-05-26 – 2011-05-29 (×4): 20 mg via ORAL
  Filled 2011-05-26 (×4): qty 1

## 2011-05-26 MED ORDER — SODIUM CHLORIDE 0.9 % IJ SOLN: 3.0000 mL | INTRAMUSCULAR | Status: DC | PRN

## 2011-05-26 MED ORDER — INSULIN ASPART 100 UNIT/ML ~~LOC~~ SOLN
0.0000 [IU] | Freq: Three times a day (TID) | SUBCUTANEOUS | Status: DC
  Administered 2011-05-26: 2 [IU] via SUBCUTANEOUS

## 2011-05-26 MED ORDER — ALUM & MAG HYDROXIDE-SIMETH 400-400-40 MG/5ML PO SUSP
15.0000 mL | ORAL | Status: DC | PRN
  Filled 2011-05-26 (×2): qty 30

## 2011-05-26 MED ORDER — ACETAMINOPHEN 325 MG PO TABS: 650.0000 mg | ORAL_TABLET | Freq: Four times a day (QID) | ORAL | Status: DC | PRN

## 2011-05-26 MED ORDER — ZOLPIDEM TARTRATE 5 MG PO TABS
5.0000 mg | ORAL_TABLET | Freq: Every evening | ORAL | Status: DC | PRN
  Filled 2011-05-26: qty 1

## 2011-05-26 MED ORDER — OXYCODONE HCL 5 MG PO TABS
5.0000 mg | ORAL_TABLET | ORAL | Status: DC | PRN
  Administered 2011-05-27: 5 mg via ORAL
  Filled 2011-05-26: qty 2
  Filled 2011-05-26: qty 1

## 2011-05-26 MED ORDER — GUAIFENESIN-DM 100-10 MG/5ML PO SYRP: 15.0000 mL | ORAL_SOLUTION | ORAL | Status: DC | PRN

## 2011-05-26 MED ORDER — BISACODYL 10 MG RE SUPP: 10.0000 mg | Freq: Every day | RECTAL | Status: DC | PRN

## 2011-05-26 MED ORDER — SODIUM CHLORIDE 0.9 % IJ SOLN
3.0000 mL | Freq: Two times a day (BID) | INTRAMUSCULAR | Status: DC
  Administered 2011-05-26 – 2011-05-29 (×6): 3 mL via INTRAVENOUS

## 2011-05-26 MED ORDER — FUROSEMIDE 40 MG PO TABS
40.0000 mg | ORAL_TABLET | Freq: Two times a day (BID) | ORAL | Status: DC
  Administered 2011-05-26 – 2011-05-29 (×7): 40 mg via ORAL
  Filled 2011-05-26 (×10): qty 1

## 2011-05-26 NOTE — Progress Notes (Signed)
Patient ID: Troy [name], male   DOB: April 19, 1941, 70 y.o.   MRN: [id number] waiting for bed on 2000 No complaints Temp:  [97.9 F (36.6 C)-99.4 F (37.4 C)] 98.5 F (36.9 C) (12/06 1500) Pulse Rate:  [79-99] 94  (12/06 1700) Cardiac Rhythm:  [-] Normal sinus rhythm (12/06 0730) Resp:  [8-23] 17  (12/06 1700) BP: (83-125)/(59-86) 103/72 mmHg (12/06 1700) SpO2:  [92 %-100 %] 98 % (12/06 1700) Weight:  [185 lb (83.915 kg)] 185 lb (83.915 kg) (12/06 0500)  Stable day Delight Ovens MD 05/26/2011 6:20 PM

## 2011-05-26 NOTE — Progress Notes (Signed)
Pt ambulated 300 feet.  Became winded halfway through.  Oxygen increased from 2L to 3L.  Patient SOB when back in room.  To BSC after walk.  Will continue to monitor.

## 2011-05-26 NOTE — Progress Notes (Signed)
2 Days Post-Op Procedure(s) (LRB): CORONARY ARTERY BYPASS GRAFTING (CABG) (N/A) Subjective: C/o incisional pain with cough No nausea, SOB, wheezing  Objective: Vital signs in last 24 hours: Temp:  [97.9 F (36.6 C)-99.4 F (37.4 C)] 97.9 F (36.6 C) (12/06 0740) Pulse Rate:  [79-109] 87  (12/06 0700) Cardiac Rhythm:  [-] Normal sinus rhythm (12/06 0600) Resp:  [8-25] 10  (12/06 0700) BP: (76-119)/(52-82) 100/68 mmHg (12/06 0700) SpO2:  [91 %-98 %] 97 % (12/06 0700) Arterial Line BP: (112-115)/(56-59) 115/58 mmHg (12/05 1200) Weight:  [83.915 kg (185 lb)] 185 lb (83.915 kg) (12/06 0500)  Hemodynamic parameters for last 24 hours: PAP: (31-42)/(14-22) 33/14 mmHg  Intake/Output from previous day: 12/05 0701 - 12/06 0700 In: 1550.4 [P.O.:580; I.V.:566.4; IV Piggyback:404] Out: 1115 [Urine:1035; Chest Tube:80] Intake/Output this shift:    General appearance: alert and no distress Neurologic: intact Heart: regular rate and rhythm Lungs: diminished breath sounds bibasilar  Lab Results:  Basename 05/26/11 0355 05/25/11 1640  WBC 6.5 7.3  HGB 7.6* 8.2*  HCT 22.1* 23.8*  PLT 153 168   BMET:  Basename 05/26/11 0355 05/25/11 1640 05/25/11 1638 05/25/11 0330  NA 130* -- 133* --  K 4.1 -- 5.3* --  CL 100 -- 100 --  CO2 26 -- -- 23  GLUCOSE 114* -- 124* --  BUN 12 -- 10 --  CREATININE 0.67 0.76 -- --  CALCIUM 8.1* -- -- 7.9*    PT/INR:  Basename 05/24/11 1422  LABPROT 18.7*  INR 1.53*   ABG    Component Value Date/Time   PHART 7.323* 05/24/2011 2018   HCO3 23.3 05/24/2011 2018   TCO2 23 05/25/2011 1638   ACIDBASEDEF 2.0 05/24/2011 2018   O2SAT 94.0 05/24/2011 2018   CBG (last 3)   Basename 05/26/11 0732 05/26/11 0333 05/25/11 2333  GLUCAP 120* 113* 111*    Assessment/Plan: S/P Procedure(s) (LRB): CORONARY ARTERY BYPASS GRAFTING (CABG) (N/A) Mobilize Diuresis Plan for transfer to step-down: see transfer orders Bibasilar atelectasis - IS   LOS: 6 days     HENDRICKSON,STEVEN C 05/26/2011

## 2011-05-27 ENCOUNTER — Inpatient Hospital Stay (HOSPITAL_COMMUNITY): Payer: Medicare Other

## 2011-05-27 LAB — BASIC METABOLIC PANEL
CO2: 27 mEq/L (ref 19–32)
Calcium: 8.5 mg/dL (ref 8.4–10.5)
Chloride: 94 mEq/L — ABNORMAL LOW (ref 96–112)
Creatinine, Ser: 0.68 mg/dL (ref 0.50–1.35)
GFR calc Af Amer: >90 mL/min (ref >90)
Sodium: 130 mEq/L — ABNORMAL LOW (ref 135–145)

## 2011-05-27 LAB — CBC
MCV: 96.8 fL (ref 78.0–100.0)
Platelets: 223 10*3/uL (ref 150–400)
RBC: 2.47 MIL/uL — ABNORMAL LOW (ref 4.22–5.81)
RDW: 13 % (ref 11.5–15.5)
WBC: 7.4 10*3/uL (ref 4.0–10.5)

## 2011-05-27 LAB — GLUCOSE, CAPILLARY: Glucose-Capillary: 107 mg/dL — ABNORMAL HIGH (ref 70–99)

## 2011-05-27 MED ORDER — ROSUVASTATIN CALCIUM 10 MG PO TABS
10.0000 mg | ORAL_TABLET | Freq: Every day | ORAL | Status: DC
  Administered 2011-05-27 – 2011-05-28 (×2): 10 mg via ORAL
  Filled 2011-05-27 (×3): qty 1

## 2011-05-27 MED ORDER — AMIODARONE HCL 200 MG PO TABS
400.0000 mg | ORAL_TABLET | Freq: Two times a day (BID) | ORAL | Status: DC
  Administered 2011-05-27 – 2011-05-29 (×5): 400 mg via ORAL
  Filled 2011-05-27 (×6): qty 2

## 2011-05-27 MED ORDER — ATENOLOL 50 MG PO TABS
50.0000 mg | ORAL_TABLET | Freq: Every day | ORAL | Status: DC
  Administered 2011-05-27 – 2011-05-29 (×3): 50 mg via ORAL
  Filled 2011-05-27 (×3): qty 1

## 2011-05-27 MED ORDER — ALUM & MAG HYDROXIDE-SIMETH 200-200-20 MG/5ML PO SUSP: 15.0000 mL | ORAL | Status: DC | PRN

## 2011-05-27 MED ORDER — DEXTROSE 5 % IV SOLN
30.0000 mg/h | INTRAVENOUS | Status: AC
  Filled 2011-05-27: qty 9

## 2011-05-27 MED FILL — Sodium Chloride Irrigation Soln 0.9%: Qty: 3000 | Status: AC

## 2011-05-27 MED FILL — Heparin Sodium (Porcine) Inj 1000 Unit/ML: INTRAMUSCULAR | Qty: 60 | Status: AC

## 2011-05-27 MED FILL — Potassium Chloride Inj 2 mEq/ML: INTRAVENOUS | Qty: 40 | Status: AC

## 2011-05-27 MED FILL — Sodium Chloride IV Soln 0.9%: INTRAVENOUS | Qty: 1000 | Status: AC

## 2011-05-27 MED FILL — Magnesium Sulfate Inj 50%: INTRAMUSCULAR | Qty: 10 | Status: AC

## 2011-05-27 MED FILL — Electrolyte-R (PH 7.4) Solution: INTRAVENOUS | Qty: 6000 | Status: AC

## 2011-05-27 MED FILL — Dexmedetomidine HCl IV Soln 200 MCG/2ML: INTRAVENOUS | Qty: 2 | Status: AC

## 2011-05-27 NOTE — Progress Notes (Signed)
3 Days Post-Op Procedure(s) (LRB): CORONARY ARTERY BYPASS GRAFTING (CABG) (N/A) Subjective: SVT last PM Now in SR on Amiodarone gtt  Objective: Vital signs in last 24 hours: Temp:  [97.9 F (36.6 C)-99.4 F (37.4 C)] 97.9 F (36.6 C) (12/07 0736) Pulse Rate:  [86-155] 90  (12/07 0700) Cardiac Rhythm:  [-] Normal sinus rhythm (12/07 0600) Resp:  [10-26] 20  (12/07 0700) BP: (80-132)/(55-96) 110/96 mmHg (12/07 0700) SpO2:  [92 %-100 %] 99 % (12/07 0700) Weight:  [83.4 kg (183 lb 13.8 oz)] 183 lb 13.8 oz (83.4 kg) (12/07 0400)  Hemodynamic parameters for last 24 hours:    Intake/Output from previous day: 12/06 0701 - 12/07 0700 In: 809.3 [P.O.:360; I.V.:449.3] Out: 1755 [Urine:1755] Intake/Output this shift:    General appearance: alert and no distress Heart: regular rate and rhythm Lungs: diminished breath sounds bilaterally Wound: clean and dry  Lab Results:  Basename 05/27/11 0400 05/26/11 0355  WBC 7.4 6.5  HGB 8.3* 7.6*  HCT 23.9* 22.1*  PLT 223 153   BMET:  Basename 05/27/11 0400 05/26/11 0355  NA 130* 130*  K 3.6 4.1  CL 94* 100  CO2 27 26  GLUCOSE 126* 114*  BUN 10 12  CREATININE 0.68 0.67  CALCIUM 8.5 8.1*    PT/INR:  Basename 05/24/11 1422  LABPROT 18.7*  INR 1.53*   ABG    Component Value Date/Time   PHART 7.323* 05/24/2011 2018   HCO3 23.3 05/24/2011 2018   TCO2 23 05/25/2011 1638   ACIDBASEDEF 2.0 05/24/2011 2018   O2SAT 94.0 05/24/2011 2018   CBG (last 3)   Basename 05/26/11 2157 05/26/11 1946 05/26/11 1532  GLUCAP 111* 102* 114*    Assessment/Plan: S/P Procedure(s) (LRB): CORONARY ARTERY BYPASS GRAFTING (CABG) (N/A) Mobilize Diuresis Plan for transfer to step-down: see transfer orders Awaiting bed on 2000 Change amio to PO Increase atenolol    LOS: 7 days    Lova Urbieta C 05/27/2011

## 2011-05-27 NOTE — Progress Notes (Signed)
Pt transferred to rm 2004 via wheelchair and with 02 nasal cannula 3L/min. Report given to RN. No complications.

## 2011-05-28 LAB — CBC
HCT: 23.2 % — ABNORMAL LOW (ref 39.0–52.0)
Hemoglobin: 8.1 g/dL — ABNORMAL LOW (ref 13.0–17.0)
MCH: 33.8 pg (ref 26.0–34.0)
MCHC: 34.9 g/dL (ref 30.0–36.0)
MCV: 96.7 fL (ref 78.0–100.0)
RDW: 12.8 % (ref 11.5–15.5)

## 2011-05-28 LAB — GLUCOSE, CAPILLARY
Glucose-Capillary: 107 mg/dL — ABNORMAL HIGH (ref 70–99)
Glucose-Capillary: 95 mg/dL (ref 70–99)

## 2011-05-28 LAB — BASIC METABOLIC PANEL: Glucose, Bld: 104 mg/dL — ABNORMAL HIGH (ref 70–99)

## 2011-05-28 MED ORDER — OXYCODONE HCL 5 MG PO TABS: 5.0000 mg | ORAL_TABLET | ORAL | Status: AC | PRN

## 2011-05-28 MED ORDER — IPRATROPIUM-ALBUTEROL 18-103 MCG/ACT IN AERO: 2.0000 | INHALATION_SPRAY | Freq: Three times a day (TID) | RESPIRATORY_TRACT | Status: DC

## 2011-05-28 MED ORDER — POTASSIUM CHLORIDE CRYS ER 20 MEQ PO TBCR: 20.0000 meq | EXTENDED_RELEASE_TABLET | Freq: Every day | ORAL | Status: DC

## 2011-05-28 MED ORDER — ROSUVASTATIN CALCIUM 10 MG PO TABS: 10.0000 mg | ORAL_TABLET | Freq: Every day | ORAL | Status: DC

## 2011-05-28 MED ORDER — IPRATROPIUM-ALBUTEROL 18-103 MCG/ACT IN AERO
2.0000 | INHALATION_SPRAY | Freq: Three times a day (TID) | RESPIRATORY_TRACT | Status: DC
  Administered 2011-05-28 – 2011-05-29 (×3): 2 via RESPIRATORY_TRACT

## 2011-05-28 MED ORDER — FUROSEMIDE 40 MG PO TABS: 40.0000 mg | ORAL_TABLET | Freq: Every day | ORAL | Status: DC

## 2011-05-28 MED ORDER — AMIODARONE HCL 400 MG PO TABS: 400.0000 mg | ORAL_TABLET | Freq: Two times a day (BID) | ORAL | Status: DC

## 2011-05-28 NOTE — Progress Notes (Signed)
CARDIAC REHAB PHASE I   PRE:  Rate/Rhythm: 82 SR    BP: sitting 106/60    SaO2: 95 RA  MODE:  Ambulation: 350 ft   POST:  Rate/Rhythm: 85    BP: sitting 100/60     SaO2: 98 RA  Used RW assist x1. Tolerated well. x1 rest for SOB. SaO2 good. D/C'd O2. Ed completed. Requests referral be sent to [facility]. Encouraged smoking cessation. [phone]  Harriet Masson CES, ACSM

## 2011-05-28 NOTE — Progress Notes (Signed)
4 Days Post-Op Procedure(s) (LRB): CORONARY ARTERY BYPASS GRAFTING (CABG) (N/A) Subjective: No c/o wants to go home  Objective: Vital signs in last 24 hours: Temp:  [97.6 F (36.4 C)-98.5 F (36.9 C)] 98 F (36.7 C) (12/08 0548) Pulse Rate:  [33-88] 84  (12/08 0548) Cardiac Rhythm:  [-] Normal sinus rhythm;Bundle branch block (12/08 0949) Resp:  [13-27] 17  (12/08 0548) BP: (105-114)/(67-78) 114/69 mmHg (12/08 0548) SpO2:  [76 %-99 %] 94 % (12/08 0907) FiO2 (%):  [3 %] 3 % (12/07 1600) Weight:  [80.4 kg (177 lb 4 oz)] 177 lb 4 oz (80.4 kg) (12/08 0500)  Hemodynamic parameters for last 24 hours:    Intake/Output from previous day: 12/07 0701 - 12/08 0700 In: 403.5 [P.O.:320; I.V.:83.5] Out: 1050 [Urine:1050] Intake/Output this shift:    General appearance: alert and no distress Heart: regular rate and rhythm Lungs: diminished breath sounds bilaterally Wound: clean and dry  Lab Results:  Basename 05/28/11 0703 05/27/11 0400  WBC 6.3 7.4  HGB 8.1* 8.3*  HCT 23.2* 23.9*  PLT 254 223   BMET:  Basename 05/28/11 0703 05/27/11 0400  NA 127* 130*  K 3.8 3.6  CL 90* 94*  CO2 28 27  GLUCOSE 104* 126*  BUN 10 10  CREATININE 0.66 0.68  CALCIUM 8.6 8.5    PT/INR: No results found for this basename: LABPROT,INR in the last 72 hours ABG    Component Value Date/Time   PHART 7.323* 05/24/2011 2018   HCO3 23.3 05/24/2011 2018   TCO2 23 05/25/2011 1638   ACIDBASEDEF 2.0 05/24/2011 2018   O2SAT 94.0 05/24/2011 2018   CBG (last 3)   Basename 05/28/11 0606 05/27/11 2136 05/27/11 1616  GLUCAP 107* 107* 118*    Assessment/Plan: S/P Procedure(s) (LRB): CORONARY ARTERY BYPASS GRAFTING (CABG) (N/A) Mobilize Diuresis home in AM if off O2   LOS: 8 days    HENDRICKSON,STEVEN C 05/28/2011

## 2011-05-28 NOTE — Discharge Summary (Signed)
Discharge Summary  Name: Troy [name] DOB: 10-02-1940 70 y.o. MRN: [id number]  Admission Date: 05/20/2011 Discharge Date:    Admitting Diagnosis: Principal Problem:  *Coronary artery disease Active Problems:  Hypertension  Tobacco abuse  Dyslipidemia   Discharge Diagnosis:  Principal Problem:  *Coronary artery disease Active Problems:  Hypertension  Tobacco abuse  Dyslipidemia postoperative atrial fibrillation  Procedures: Procedure(s): CORONARY ARTERY BYPASS GRAFTING (CABG)  x5 (left internal mammary artery to the LAD, saphenous vein graft to the first diagonal,  Saphenous vein graft to obtuse marginal 2, sequential saphenous vein graft to acute marginal and distal right coronary) endoscopic vein harvest right leg  HPI:  The patient is a 70 y.o. male with a known history of coronary artery disease status post PTCA at [facility] 22 years ago. He's been asymptomatic recently. For the past several weeks he has been having chest pain radiating to the right arm and jaw. Several episodes have occurred at rest and last week he had a prolonged episode lasting about 15 minutes.  He was seen by Dr. Andee Lineman and underwent an echocardiogram which showed normal left ventricular function with mild AI and MR. Cardiac catheterization was recommended. This was performed on the date of admission and he was found to have severe left main and three-vessel coronary artery disease. He was subsequently admitted for cardiac workup.   Hospital Course:  The patient was admitted to [facility] on 05/20/2011 following cardiac catheterization. A cardiac surgery consult requested and the patient was seen by Dr. Dorris Fetch. His films were reviewed and Dr. Dorris Fetch agreed with the need for surgical revascularization..  All risks, benefits and alternatives of surgery were explained in detail, and the patient agreed to proceed. The patient was taken to the operating room and underwent the above procedure.  The  postoperative course was notable for atrial fibrillation which converted to sinus rhythm on amiodarone. He also had a mild exacerbation of his COPD and was started on a metered dose inhaler and nebulizer treatments as well as pulmonary toilet. He is isomewhat volume overloaded and was started on Lasix to which he is responding well. He has been transferred to the step down unit and is progressing well with mobility and diet. Anticipate discharge home within the next 24 hours if he remains stable.   Recent vital signs:  Filed Vitals:   05/28/11 0548  BP: 114/69  Pulse: 84  Temp: 98 F (36.7 C)  Resp: 17    Recent laboratory studies:  CBC: Basename 05/28/11 0703 05/27/11 0400  WBC 6.3 7.4  HGB 8.1* 8.3*  HCT 23.2* 23.9*  PLT 254 223   BMET:  Basename 05/28/11 0703 05/27/11 0400  NA 127* 130*  K 3.8 3.6  CL 90* 94*  CO2 28 27  GLUCOSE 104* 126*  BUN 10 10  CREATININE 0.66 0.68  CALCIUM 8.6 8.5    PT/INR: No results found for this basename: LABPROT,INR in the last 72 hours  Discharge Medications:  Current Discharge Medication List    START taking these medications   Details  albuterol-ipratropium (COMBIVENT) 18-103 MCG/ACT inhaler Inhale 2 puffs into the lungs 3 (three) times daily. Qty: 1 Inhaler, Refills: 0    amiodarone (PACERONE) 400 MG tablet Take 1 tablet (400 mg total) by mouth 2 (two) times daily. X 1 week, then 200 mg BID Qty: 75 tablet, Refills: 1    furosemide (LASIX) 40 MG tablet Take 1 tablet (40 mg total) by mouth daily. Qty: 7 tablet, Refills: 0  oxyCODONE (OXY IR/ROXICODONE) 5 MG immediate release tablet Take 1-2 tablets (5-10 mg total) by mouth every 3 (three) hours as needed for pain. Qty: 30 tablet, Refills: 0    potassium chloride SA (K-DUR,KLOR-CON) 20 MEQ tablet Take 1 tablet (20 mEq total) by mouth daily. Qty: 7 tablet, Refills: 0    rosuvastatin (CRESTOR) 10 MG tablet Take 1 tablet (10 mg total) by mouth daily at 6 PM. Qty: 30 tablet,  Refills: 1      CONTINUE these medications which have NOT CHANGED   Details  aspirin 81 MG tablet Take 81 mg by mouth daily.     atenolol (TENORMIN) 50 MG tablet Take 50 mg by mouth daily.      citalopram (CELEXA) 20 MG tablet Take 20 mg by mouth daily.      Omeprazole 20 MG TBEC Take 20 mg by mouth daily.        STOP taking these medications     diltiazem (CARDIZEM CD) 180 MG 24 hr capsule      lovastatin (MEVACOR) 40 MG tablet      nitroGLYCERIN (NITROSTAT) 0.4 MG SL tablet         Discharge Instructions:  The patient is to refrain from driving, heavy lifting or strenuous activity.  May shower daily and clean incisions with soap and water.  May resume regular diet.  Discharge Orders    Future Appointments: Provider: Department: Dept Phone: Center:   08/26/2011 9:00 AM [facility] [phone] [facility]   08/26/2011 9:30 AM Nilda Simmer, MD [facility] [phone] [facility]      Follow-up Information    Follow up with Peyton Bottoms, MD. Make an appointment in 2 weeks.   Contact information:   [address] [place] [address] [phone]       Follow up with Loreli Slot, MD. (office will contact you)    Contact information:   [address] [place] [address] [phone]           Adella Hare 05/28/2011, 12:15 PM

## 2011-05-29 NOTE — Progress Notes (Signed)
Pt. Discharged 05/29/2011  3:17 PM Discharge instructions reviewed with patient/family. Patient/family verbalized understanding. All Rx's given. Questions answered as needed. Pt. Discharged to home with family/self. Taken off unit via W/C. Denya Buckingham, Chrystine Oiler

## 2011-05-29 NOTE — Progress Notes (Addendum)
5 Days Post-Op Procedure(s) (LRB): CORONARY ARTERY BYPASS GRAFTING (CABG) (N/A)  Subjective: Feels well.  No complaints.  Breathing stable, off O2.  Objective: Vital signs in last 24 hours: Patient Vitals for the past 24 hrs:  BP Temp Temp src Pulse Resp SpO2 Weight  05/29/11 0617 111/74 mmHg 98.3 F (36.8 C) Oral 82  18  94 % 78.155 kg (172 lb 4.8 oz)  05/28/11 2113 - - - - - 86 % -  05/28/11 2100 104/68 mmHg 98.9 F (37.2 C) Oral 85  18  92 % -  05/28/11 1522 95/63 mmHg 98.1 F (36.7 C) Oral 84  18  90 % -  05/28/11 1341 - - - - - 90 % -  05/28/11 0907 - - - - - 94 % -   Current Weight  05/29/11 78.155 kg (172 lb 4.8 oz)     Intake/Output from previous day: 12/08 0701 - 12/09 0700 In: 480 [P.O.:480] Out: 301 [Urine:300; Stool:1]    PHYSICAL EXAM:  Heart: RRR Lungs: clear Wound: clean and dry Extremities: mild Rt LE edema  Lab Results: CBC: Basename 05/28/11 0703 05/27/11 0400  WBC 6.3 7.4  HGB 8.1* 8.3*  HCT 23.2* 23.9*  PLT 254 223   BMET:  Basename 05/28/11 0703 05/27/11 0400  NA 127* 130*  K 3.8 3.6  CL 90* 94*  CO2 28 27  GLUCOSE 104* 126*  BUN 10 10  CREATININE 0.66 0.68  CALCIUM 8.6 8.5    PT/INR: No results found for this basename: LABPROT,INR in the last 72 hours   Assessment/Plan: S/P Procedure(s) (LRB): CORONARY ARTERY BYPASS GRAFTING (CABG) (N/A) Home today.   LOS: 9 days    COLLINS,GINA H 05/29/2011   I have seen and examined the patient and agree with the assessment above.

## 2011-05-29 NOTE — Progress Notes (Signed)
EPW discontinued per protocol. Tips intact. Patient tolerated well. Last INR/ 1.53   .  Patient advised Bedrest X 1 hour. Troy [name], Chrystine Oiler

## 2011-05-30 NOTE — Progress Notes (Signed)
CARE MANAGEMENT NOTE 05/30/2011  Patient:  Troy [name], Troy [name]   Account Number:  [id number]  Date Initiated:  05/30/2011  Documentation initiated by:  [facility]  Subjective/Objective Assessment:   CAD     Action/Plan:   Anticipated DC Date:  05/29/2011   Anticipated DC Plan:  HOME/SELF CARE      DC Planning Services  CM consult      Choice offered to / List presented to:     DME arranged  Levan Hurst      DME agency  [facility]        Status of service:  Completed, signed off Medicare Important Message given?   (If response is "NO", the following Medicare IM given date fields will be blank) Date Medicare IM given:   Date Additional Medicare IM given:    Discharge Disposition:  HOME/SELF CARE  Per UR Regulation:    Comments:  05/30/2011 1040 late entry for 05/29/2011 1500 Contacted [facility] for RW for home for scheduled d/c today. Isidoro Donning RN CCM Case Mgmt phone [phone]

## 2011-06-06 ENCOUNTER — Other Ambulatory Visit: Payer: Self-pay | Admitting: Thoracic Surgery (Cardiothoracic Vascular Surgery)

## 2011-06-06 DIAGNOSIS — I2510 Atherosclerotic heart disease of native coronary artery without angina pectoris: Secondary | ICD-10-CM

## 2011-06-08 ENCOUNTER — Ambulatory Visit
Admission: RE | Admit: 2011-06-08 | Discharge: 2011-06-08 | Disposition: A | Discharge status: Home / Self Care | Payer: Medicare Other | Source: Ambulatory Visit | Attending: Thoracic Surgery (Cardiothoracic Vascular Surgery) | Admitting: Thoracic Surgery (Cardiothoracic Vascular Surgery)

## 2011-06-08 ENCOUNTER — Ambulatory Visit (INDEPENDENT_AMBULATORY_CARE_PROVIDER_SITE_OTHER): Payer: Self-pay | Admitting: Thoracic Surgery (Cardiothoracic Vascular Surgery)

## 2011-06-08 ENCOUNTER — Encounter: Payer: Self-pay | Admitting: Thoracic Surgery (Cardiothoracic Vascular Surgery)

## 2011-06-08 VITALS — BP 83/56 | HR 77 | Resp 16 | Ht 71.0 in | Wt 164.0 lb

## 2011-06-08 DIAGNOSIS — I2510 Atherosclerotic heart disease of native coronary artery without angina pectoris: Secondary | ICD-10-CM

## 2011-06-08 DIAGNOSIS — Z09 Encounter for follow-up examination after completed treatment for conditions other than malignant neoplasm: Secondary | ICD-10-CM

## 2011-06-08 NOTE — Progress Notes (Signed)
HPI:  Mr. Troy [name] returns for a scheduled postoperative followup visit after having undergone coronary bypass grafting x5 on December 4. Postoperatively he had atrial fibrillation, this converted to sinus rhythm with amiodarone. He did not require anticoagulation. Since his discharge his been feeling well. He has only taken one pain pill, since then he hasn't taken any of them. He has not had chest pain or shortness of breath. Overall he feels well and is anxious to increase his activities.   Current Outpatient Prescriptions  Medication Sig Dispense Refill  . albuterol-ipratropium (COMBIVENT) 18-103 MCG/ACT inhaler Inhale 2 puffs into the lungs 3 (three) times daily.  1 Inhaler  0  . amiodarone (PACERONE) 400 MG tablet Take 1 tablet (400 mg total) by mouth 2 (two) times daily. X 1 week, then 200 mg BID  75 tablet  1  . aspirin 81 MG tablet Take 81 mg by mouth daily.       . citalopram (CELEXA) 20 MG tablet Take 20 mg by mouth daily.        . furosemide (LASIX) 40 MG tablet Take 1 tablet (40 mg total) by mouth daily.  7 tablet  0  . Omeprazole 20 MG TBEC Take 20 mg by mouth daily.        . rosuvastatin (CRESTOR) 10 MG tablet Take 1 tablet (10 mg total) by mouth daily at 6 PM.  30 tablet  1  . atenolol (TENORMIN) 50 MG tablet Take 50 mg by mouth daily.        [name] diltiazem (CARDIZEM CD) 180 MG 24 hr capsule       . isosorbide mononitrate (IMDUR) 30 MG 24 hr tablet       . lovastatin (MEVACOR) 40 MG tablet       . oxyCODONE (OXY IR/ROXICODONE) 5 MG immediate release tablet Take 1-2 tablets (5-10 mg total) by mouth every 3 (three) hours as needed for pain.  30 tablet  0  . potassium chloride SA (K-DUR,KLOR-CON) 20 MEQ tablet Take 1 tablet (20 mEq total) by mouth daily.  7 tablet  0    Physical Exam BP 83/56  Pulse 77  Resp 16  Ht 5\' 11"  (1.803 m)  Wt 164 lb (74.39 kg)  BMI 22.87 kg/m2  SpO2 94% Gen. Thin, well-nourished, in no acute distress Neuro alert and oriented x3, nonfocal Lungs  clear bilaterally, no rales or wheezing Cardiac regular rate and rhythm, no rub Sternum is stable, sternal and leg incisions clean dry and intact No peripheral edema  Diagnostic Tests: Chest x-ray shows small bilateral effusions  Impression: Troy [name] is doing very well now, just 2 weeks following coronary bypass grafting. His wounds are healing well. He is maintaining sinus rhythm on amiodarone. He has minimal discomfort and his exercise tolerance is good. I advised him not to drive until the first of the year, and then discussed appropriate precautions for when he begins driving at that time. He's not to lift any objects greater than 10 pounds for another month. He has a followup appointment scheduled with Dr. Andee Lineman indeed in on December 31.  Plan: The patient will see Dr. Andee Lineman on 06/20/2011 I would be happy to see him back again at any time in the future if I can be of any further assistance with his care. He was given a prescription for Crestor at the time of discharge, he had been on lovastatin prior to admission. He will resume lovastatin when his crestor prescription runs out in 2 weeks.  He is on Amiodarone for postop atrial fibrillation. He should stay on that for another month unless Dr. Andee Lineman advises him otherwise.

## 2011-06-20 ENCOUNTER — Ambulatory Visit (INDEPENDENT_AMBULATORY_CARE_PROVIDER_SITE_OTHER): Payer: Medicare Other | Admitting: Cardiology

## 2011-06-20 ENCOUNTER — Encounter: Payer: Self-pay | Admitting: Cardiology

## 2011-06-20 VITALS — BP 110/76 | HR 66 | Ht 71.0 in | Wt 169.0 lb

## 2011-06-20 DIAGNOSIS — I2510 Atherosclerotic heart disease of native coronary artery without angina pectoris: Secondary | ICD-10-CM

## 2011-06-20 DIAGNOSIS — I4891 Unspecified atrial fibrillation: Secondary | ICD-10-CM

## 2011-06-20 DIAGNOSIS — Z951 Presence of aortocoronary bypass graft: Secondary | ICD-10-CM

## 2011-06-20 MED ORDER — AMIODARONE HCL 400 MG PO TABS: 200.0000 mg | ORAL_TABLET | Freq: Two times a day (BID) | ORAL | Status: DC

## 2011-06-20 NOTE — Patient Instructions (Signed)
   Stop Amiodarone on 07/08/2011 Your physician wants you to follow up in: 6 months.  You will receive a reminder letter in the mail one-two months in advance.  If you don't receive a letter, please call our office to schedule the follow up appointment

## 2011-06-21 ENCOUNTER — Encounter: Payer: Self-pay | Admitting: Cardiology

## 2011-06-21 DIAGNOSIS — Z951 Presence of aortocoronary bypass graft: Secondary | ICD-10-CM | POA: Insufficient documentation

## 2011-06-21 DIAGNOSIS — I4891 Unspecified atrial fibrillation: Secondary | ICD-10-CM | POA: Insufficient documentation

## 2011-06-21 DIAGNOSIS — I9789 Other postprocedural complications and disorders of the circulatory system, not elsewhere classified: Secondary | ICD-10-CM | POA: Insufficient documentation

## 2011-06-21 NOTE — Progress Notes (Signed)
Troy Bottoms, MD, [facility] ABIM Board Certified in Adult Cardiovascular Medicine,Internal Medicine and Critical Care Medicine    UJ:[id number] after recent bypass surgery.  HPI:  The patient is a 71 year old male with significant peripheral vascular disease including AAA which we'll treat surgical dimensions and is followed by vascular surgery.  He also has peripheral vascular disease with stable ABIs but no claudication. He was recently referred for cardiac catheterization because of chest pain.  He was found to have multivessel coronary artery disease and underwent coronary artery bypass grafting x5 by Dr. Beryle Quant on June 03, 2011.  He developed postoperative atrial fibrillation and was placed on amiodarone.  He is currently normal sinus rhythm with right bundle branch block and left anterior hemiblock. The patient has done remarkably well.  He feels he has a lot more energy.  He denies any chest pain shortness of breath orthopnea or PND.  He has stopped smoking.  He reports no palpitations.  His sternotomy scar is well healing.  He does have some nonspecific left shoulder pain which is somewhat chronic.     PMH: reviewed and listed in Problem List in Electronic Records (and see below) Past Medical History  Diagnosis Date  . COPD (chronic obstructive pulmonary disease)   . Hypertension   . Hyperlipidemia   . Ulcer   . Reflux   . AAA (abdominal aortic aneurysm)   . CAD (coronary artery disease)     cath 11.30.2012 - 3vd  . Bilateral cataracts   . Retinal detachment     history of right detachment  . Angina   . Cancer     SPOT ON LUNG NOT DIAGNOSTED AS CANCER KEEPING AN EYE ON IT  . CHF (congestive heart failure)   . GERD (gastroesophageal reflux disease)   . Hiatal hernia   . Depression   . Status post coronary artery bypass grafting     surgery May 24, 2011 by Dr. Dorris Fetch.  [name] Postoperative atrial fibrillation     amiodarone therapy   Past Surgical History   Procedure Date  . Lung surgery 1964  . Nose surgery   . Stint 1992    Stint placement  . Coronary angioplasty   . Eye surgery     [name] DETACHED HAD SURGERY FOR IT  . Coronary artery bypass graft 05/24/2011    Procedure: CORONARY ARTERY BYPASS GRAFTING (CABG);  Surgeon: Loreli Slot, MD;  Location: [facility] OR;  Service: Open Heart Surgery;  Laterality: N/A;  Coronary Artery Bypass Graft on pump times five, utlizing left internal mammary artery and right saphenous vein harvested endoscopically     Allergies/SH/FHX : available in Electronic Records for review  Allergies  Allergen Reactions  . Isosorbide Mononitrate     SEVER HEAD ACH ; PT COULD NOT SLEEP FOR 2 DAYS  . Penicillins    History   Social History  . Marital Status: Married    Spouse Name: N/A    Number of Children: N/A  . Years of Education: N/A   Occupational History  . Not on file.   Social History Main Topics  . Smoking status: Former Smoker -- 1.5 packs/day for 56 years    Types: Cigarettes    Quit date: 05/20/2011  . Smokeless tobacco: Never Used   Comment: DOES NOT WANT TO QUIT BUT HAS TOO  . Alcohol Use: 4.2 oz/week    7 Shots of liquor per week  . Drug Use: No  . Sexually Active: Yes  Other Topics Concern  . Not on file   Social History Narrative  . No narrative on file   Family History  Problem Relation Age of Onset  . Heart disease Father   . Heart disease Sister   . Heart disease Brother     Medications: Current Outpatient Prescriptions  Medication Sig Dispense Refill  . albuterol-ipratropium (COMBIVENT) 18-103 MCG/ACT inhaler Inhale 2 puffs into the lungs 3 (three) times daily.  1 Inhaler  0  . amiodarone (PACERONE) 400 MG tablet Take 0.5 tablets (200 mg total) by mouth 2 (two) times daily. X 1 week, then 200 mg BID      . aspirin 81 MG tablet Take 81 mg by mouth daily.       [name] atenolol (TENORMIN) 50 MG tablet Take 50 mg by mouth daily.        . citalopram (CELEXA) 20 MG tablet  Take 20 mg by mouth daily.        . Omeprazole 20 MG TBEC Take 20 mg by mouth daily.        . rosuvastatin (CRESTOR) 10 MG tablet Take 1 tablet (10 mg total) by mouth daily at 6 PM.  30 tablet  1  . lovastatin (MEVACOR) 40 MG tablet Take 40 mg by mouth at bedtime. WILL RESTART AFTER CRESTOR FINISHES        ROS: No nausea or vomiting. No fever or chills.No melena or hematochezia.No bleeding.No claudication.  Physical Exam: BP 110/76  Pulse 66  Ht 5\' 11"  (1.803 m)  Wt 169 lb (76.658 kg)  BMI 23.57 kg/m2 General:well-nourished white male in no apparent distress Neck:normal carotid upstroke and no carotid bruits.  No thyromegaly lung nodule of thyroid.  JVD is approximately 5 cm. Lungs:diminished breath sounds bilaterally without wheezing. Cardiac:regular rate and rhythm with normal S1 and S2 and no murmur rubs or gallops Chest: Well-healed midsternotomy scar.  Stable on palpation Vascular:no edema.  Distal pulses are palpable bilaterally Skin:warm and dry Physcologic:normal affect  12lead ZOX:WRUEAV sinus rhythm with right bundle branch block and left anterior hemiblock. Limited bedside ECHO:N/A   Patient Active Problem List  Diagnoses  . AAA (abdominal aortic aneurysm)-followed by vascular surgery.  . Coronary artery disease  . S/P thoracotomy  . Hypertension-well-controlled  . Depression  . 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 Tobacco abuse  . Dyslipidemia  . Postoperative atrial fibrillation currently on amiodarone therapy  . Status post coronary artery bypass grafting preserved LV function    PLAN   Patient is doing well postoperatively after coronary artery bypass grafting.  He is start driving again the beginning of the year.  He still limited to 10 pounds of weights for the next months.  He will be continued on amiodarone for postoperative atrial fibrillation that this can be discontinued on July 07, 1998 and routine  I counseled the  patient again amassed risk factor modification in particular his tobacco use and monitoring and treating carefully his dyslipidemia.  He will also need followup with vascular surgery as the patient may require surgery for AAA in the future.  Continue aspirin to prevent early graft closure.

## 2011-08-01 NOTE — Addendum Note (Signed)
Addendum  created 08/01/11 1851 by Rivka Barbara, MD   Modules edited:Anesthesia Events

## 2011-08-25 ENCOUNTER — Encounter: Payer: Self-pay | Admitting: Vascular Surgery

## 2011-08-26 ENCOUNTER — Ambulatory Visit (INDEPENDENT_AMBULATORY_CARE_PROVIDER_SITE_OTHER): Payer: Medicare Other | Admitting: *Deleted

## 2011-08-26 ENCOUNTER — Encounter: Payer: Self-pay | Admitting: Vascular Surgery

## 2011-08-26 ENCOUNTER — Ambulatory Visit (INDEPENDENT_AMBULATORY_CARE_PROVIDER_SITE_OTHER): Payer: Medicare Other | Admitting: Vascular Surgery

## 2011-08-26 VITALS — BP 117/75 | HR 63 | Resp 16 | Ht 71.0 in | Wt 175.0 lb

## 2011-08-26 DIAGNOSIS — I714 Abdominal aortic aneurysm, without rupture, unspecified: Secondary | ICD-10-CM | POA: Insufficient documentation

## 2011-08-26 DIAGNOSIS — I723 Aneurysm of iliac artery: Secondary | ICD-10-CM | POA: Insufficient documentation

## 2011-08-26 NOTE — Progress Notes (Signed)
[facility] [place]  Established Abdominal Aortic Aneurysm  History of Present Illness  The patient is a 71 y.o. (January 15, 1941) male who presents with chief complaint: follow up for AAA.  Previous studies demonstrate an AAA, measuring 4.5 cm and 4.6 cm.  The patient has some intermittent LLQ abdominal pain.  The patient underwent a CABG in Dec 2012.  He no longer is a smoker.  The patient's PMH, PSH, SH, FamHx, Med, Allergies and ROS are unchanged from 02/25/11.  Physical Examination  Filed Vitals:   08/26/11 0928  BP: 117/75  Pulse: 63  Resp: 16  Height: 5\' 11"  (1.803 m)  Weight: 175 lb (79.379 kg)  SpO2: 97%   Body mass index is 24.41 kg/(m^2).  General: A&O x 3, WDWN  Pulmonary: Sym exp, good air movt, CTAB, no rales, rhonchi, & wheezing  Cardiac: RRR, Nl S1, S2, no Murmurs, rubs or gallops  Vascular: Vessel Right Left  Radial Palpable Palpable  Brachial Palpable Palpable  Carotid Palpable, without bruit Palpable, without bruit  Aorta Non-palpable N/A  Femoral Palpable Palpable  Popliteal Non-palpable Non-palpable  PT Faintly Palpable Faintly Palpable  DP Faintly Palpable Faintly Palpable   Gastrointestinal: soft, NTND, -G/R, - HSM, - masses, - CVAT B  Musculoskeletal: M/S 5/5 throughout , Extremities without ischemic changes   Neurologic: Pain and light touch intact in extremities , Motor exam as listed above  Non-Invasive Vascular Imaging  AAA Duplex (Date: 08/26/11)  Previous size: 4.5 cm x 4.6 cm (Date: 09/07/10)  Previous size:  4.03 cm (Date: 02/27/11)  Current size:  4.10 cm (technically difficult study due to bowel gas)  R iliac: 1.65 cm x 1.65 cm (1.9 cm in 02/27/11)  L iliac: 1.35 cm x 1.35 cm (1.3 cm in 02/27/11)  Medical Decision Making  The patient is a 71 y.o. male who presents with: small asx AAA and small asx iliac aneurysms.   The last two studies demonstrate an AAA ~4.1 cm in size maximally, so the one study with 4.5 cm may  be in error.  As neither size would meet criteria for repair, I don't think a CT scan is absolutely needed.  The patient would prefer to defer the CT at this time, so we will do so.  Based on this patient's exam and diagnostic studies, he needs continued surveillance 6 month interval.  The threshold for repair is AAA size > 5.5 cm, growth > 1 cm/yr, and symptomatic status or if iliac aneurysms > 3.5 cm.  The AAA and iliac aneurysm would be repaired at the same time.  The patient will follow up in 6 months with the above studies with out NP.  I emphasized the importance of maximal medical management including strict control of blood pressure, blood glucose, and lipid levels, antiplatelet agents, obtaining regular exercise, and cessation of smoking.    Thank you for allowing [place] to participate in this patient's care.  Leonides Sake, MD [facility] [place] Office: [phone] Pager: [phone]  08/26/2011, 10:16 AM

## 2011-09-02 NOTE — Procedures (Unsigned)
DUPLEX ULTRASOUND OF ABDOMINAL AORTA  INDICATION:  Followup AAA and common iliac artery aneurysm.  HISTORY: Diabetes:  No Cardiac:  No Hypertension:  Yes Smoking:  Yes Connective Tissue Disorder: Family History:  No Previous Surgery:  No  DUPLEX EXAM:         AP (cm)                   TRANSVERSE (cm) Proximal             Not visualized            Not visualized Mid                  4.10 cm                   Not visualized Distal               2.92 cm                   3.05 cm Right Iliac          1.65 cm                   1.65 cm Left Iliac           1.35 cm                   1.35 cm  PREVIOUS:  Date: 02/25/2011  AP:  4.03  TRANSVERSE:  IMPRESSION:  Abdominal aortic aneurysm appears irregular and the aorta is somewhat tortuous which makes measuring accuracy difficult, however today's measurement of the mid aorta is 4.10 cm.  Limited visualization due to patient not being n.p.o. and has been smoking prior to his exam.  ___________________________________________ Fransisco Hertz, MD  EM/MEDQ  D:  08/26/2011  T:  08/26/2011  Job:  [id number]

## 2011-11-15 ENCOUNTER — Telehealth: Payer: Self-pay | Admitting: *Deleted

## 2011-11-15 NOTE — Telephone Encounter (Signed)
Message left on voicemail, left by wife - stating that husband had heart surgery first of December, scheduled to see GD in June.  Had chest pain over the weekend, took 2 NTG, passed out, needs to be seen.  Patient walked into office regarding above message - advised pt that ED was the most appropriate place for him.  States EMS did come to his house on Saturday, but he refused to go to [place].  Tried to ask patient how we could help him today & he basically did not want to listen to what I was telling him.  Stated he was not happy & left without any further conversation.

## 2011-11-15 NOTE — Telephone Encounter (Signed)
Received call back from patient & wife Troy [name]).  Patient apologized & states he has had a lot of things going on lately.    Patient states he is not having any active chest pain currently.  States he is having back issue as well & had MRI this morning.   Wife Troy [name]) explained further that he had taken Flexeril on Friday due to back pain.  States he got up to go to bathroom on Saturday & passed black stool x 1.  None since.  Came back to sit on edge of bed & passed out.  Patient had already taken the nitroglycerin by this time also.  Advised wife to have him see PMD (Troy [name]) for evaluation of black stool.    States that the SOB does seem to be more noticeable now since that episode, but does have COPD also.  Explained to wife again the importance of following up in the ED after taking the Nitroglycerin or for any worsening symptoms.    OV scheduled for June 10 with Troy [name].  Advised wife & patient - will send message to Troy [name] for advice & will return call as soon as response given.

## 2011-11-15 NOTE — Telephone Encounter (Signed)
Office manager notified.

## 2011-11-16 ENCOUNTER — Other Ambulatory Visit: Payer: Self-pay | Admitting: Internal Medicine

## 2011-11-17 ENCOUNTER — Encounter: Payer: Self-pay | Admitting: Physician Assistant

## 2011-11-17 ENCOUNTER — Other Ambulatory Visit: Payer: Self-pay | Admitting: Physician Assistant

## 2011-11-17 DIAGNOSIS — R072 Precordial pain: Secondary | ICD-10-CM

## 2011-11-17 DIAGNOSIS — R079 Chest pain, unspecified: Secondary | ICD-10-CM

## 2011-11-23 ENCOUNTER — Other Ambulatory Visit: Payer: Self-pay

## 2011-11-23 ENCOUNTER — Other Ambulatory Visit (INDEPENDENT_AMBULATORY_CARE_PROVIDER_SITE_OTHER): Payer: Medicare Other

## 2011-11-23 DIAGNOSIS — R072 Precordial pain: Secondary | ICD-10-CM

## 2011-11-23 DIAGNOSIS — I2510 Atherosclerotic heart disease of native coronary artery without angina pectoris: Secondary | ICD-10-CM

## 2011-11-23 DIAGNOSIS — I4891 Unspecified atrial fibrillation: Secondary | ICD-10-CM

## 2011-11-24 ENCOUNTER — Encounter: Payer: Self-pay | Admitting: Physician Assistant

## 2011-11-24 ENCOUNTER — Other Ambulatory Visit: Payer: Medicare Other

## 2011-11-24 ENCOUNTER — Ambulatory Visit (INDEPENDENT_AMBULATORY_CARE_PROVIDER_SITE_OTHER): Payer: Medicare Other | Admitting: Physician Assistant

## 2011-11-24 VITALS — BP 128/24 | HR 62 | Ht 71.0 in | Wt 177.0 lb

## 2011-11-24 DIAGNOSIS — I2510 Atherosclerotic heart disease of native coronary artery without angina pectoris: Secondary | ICD-10-CM

## 2011-11-24 DIAGNOSIS — E785 Hyperlipidemia, unspecified: Secondary | ICD-10-CM

## 2011-11-24 DIAGNOSIS — I714 Abdominal aortic aneurysm, without rupture: Secondary | ICD-10-CM

## 2011-11-24 DIAGNOSIS — I4891 Unspecified atrial fibrillation: Secondary | ICD-10-CM

## 2011-11-24 DIAGNOSIS — E782 Mixed hyperlipidemia: Secondary | ICD-10-CM

## 2011-11-24 NOTE — Progress Notes (Signed)
HPI: Patient presents in post hospital followup from [facility], seen by [place] in consultation for atypical CP on May 30, with normal cardiac markers, negative d-dimer. Outpatient 2-D echo was recommended, completed yesterday, and within normal limits (EF 60-65%), with no WMAs, no pericardial effusion, and no significant abnormalities. No medication adjustments were recommended.  Patient had one singular episode of CP sinc DC, again right-sided, overlying the right rib cage, and which awoke him from sleep. It lasted less than 5 minutes in duration. Again, he denies any exertional CP.  Allergies  Allergen Reactions  . Isosorbide Mononitrate     SEVER HEAD ACH ; PT COULD NOT SLEEP FOR 2 DAYS  . Penicillins     Current Outpatient Prescriptions  Medication Sig Dispense Refill  . aspirin 81 MG tablet Take 81 mg by mouth daily.       [name] atenolol (TENORMIN) 50 MG tablet Take 50 mg by mouth daily.        . calcitonin, salmon, (MIACALCIN/FORTICAL) 200 UNIT/ACT nasal spray Place 1 spray into the nose daily. Rotates left & right daily      . calcium carbonate (OS-CAL) 600 MG TABS Take 600 mg by mouth 2 (two) times daily.      . citalopram (CELEXA) 20 MG tablet Take 20 mg by mouth daily.        [name] lovastatin (MEVACOR) 40 MG tablet Take 40 mg by mouth at bedtime.       . nitroGLYCERIN (NITROSTAT) 0.4 MG SL tablet Place 0.4 mg under the tongue every 5 (five) minutes as needed.      . Omeprazole 20 MG TBEC Take 20 mg by mouth daily.        . SYMBICORT 160-4.5 MCG/ACT inhaler 1 puff 2 (two) times daily.       [name] DISCONTD: omeprazole (PRILOSEC) 20 MG capsule         Past Medical History  Diagnosis Date  . COPD (chronic obstructive pulmonary disease)   . Hypertension   . Hyperlipidemia   . Ulcer   . Reflux   . AAA (abdominal aortic aneurysm)   . CAD (coronary artery disease)     cath 11.30.2012 - 3vd  . Bilateral cataracts   . Retinal detachment     history of right detachment  . Angina   . Cancer     SPOT  ON LUNG NOT DIAGNOSTED AS CANCER KEEPING AN EYE ON IT  . CHF (congestive heart failure)   . GERD (gastroesophageal reflux disease)   . Hiatal hernia   . Depression   . Status post coronary artery bypass grafting     surgery May 24, 2011 by Dr. Dorris Fetch.  [name] Postoperative atrial fibrillation     amiodarone therapy    Past Surgical History  Procedure Date  . Lung surgery 1964  . Nose surgery   . Stint 1992    Stint placement  . Coronary angioplasty   . Eye surgery     [name] DETACHED HAD SURGERY FOR IT  . Coronary artery bypass graft 05/24/2011    Procedure: CORONARY ARTERY BYPASS GRAFTING (CABG);  Surgeon: Loreli Slot, MD;  Location: [facility];  Service: Open Heart Surgery;  Laterality: N/A;  Coronary Artery Bypass Graft on pump times five, utlizing left internal mammary artery and right saphenous vein harvested endoscopically     History   Social History  . Marital Status: Married    Spouse Name: N/A    Number of Children: N/A  .  Years of Education: N/A   Occupational History  . Not on file.   Social History Main Topics  . Smoking status: Former Smoker -- 1.5 packs/day for 56 years    Types: Cigarettes    Quit date: 05/20/2011  . Smokeless tobacco: Never Used   Comment: DOES NOT WANT TO QUIT BUT HAS TOO  . Alcohol Use: 4.2 oz/week    7 Shots of liquor per week  . Drug Use: No  . Sexually Active: Yes   Other Topics Concern  . Not on file   Social History Narrative  . No narrative on file    Family History  Problem Relation Age of Onset  . Heart disease Father   . Heart disease Sister   . Heart disease Brother     ROS: no nausea, vomiting; no fever, chills; no melena, hematochezia; no claudication  PHYSICAL EXAM: BP 128/24  Pulse 62  Ht 5\' 11"  (1.803 m)  Wt 177 lb (80.287 kg)  BMI 24.69 kg/m2 GENERAL:  71 year old male, sitting upright; NAD HEENT: NCAT, PERRLA, EOMI; sclera clear; no xanthelasma NECK: palpable bilateral carotid pulses, no  bruits; no JVD; no TM LUNGS: CTA bilaterally CARDIAC: RRR (S1, S2); no significant murmurs; no rubs or gallops ABDOMEN: soft, non-tender; intact BS EXTREMETIES: intact distal pulses; no significant peripheral edema SKIN: warm/dry; no obvious rash/lesions MUSCULOSKELETAL: no joint deformity NEURO: no focal deficit; NL affect   EKG: reviewed and available in Electronic Records   ASSESSMENT & PLAN:  No problem-specific assessment & plan notes found for this encounter.   Gene Cheron Pasquarelli, PAC

## 2011-11-24 NOTE — Patient Instructions (Addendum)
Continue all current medications. Your physician recommends that you go to the [facility] lab for blood work for fasting lipid & liver panel.  Reminder:  Nothing to eat or drink after 12 midnight prior to labs. If the results of your test are normal or stable, you will receive a letter.  If they are abnormal, the nurse will contact you by phone. Your physician wants you to follow up in: 6 months.  You will receive a reminder letter in the mail one-two months in advance.  If you don't receive a letter, please call our office to schedule the follow up appointment

## 2011-11-24 NOTE — Assessment & Plan Note (Signed)
Followed by vascular surgery. 

## 2011-11-24 NOTE — Assessment & Plan Note (Signed)
No further cardiac workup indicated. Recent hospitalization for atypical CP, with normal cardiac markers and negative d-dimer. Followup outpatient 2-D echo within normal limits. Continue current medication regimen, and return for followup with Dr. Andee Lineman in 6 months.

## 2011-11-24 NOTE — Assessment & Plan Note (Signed)
Reassess lipid status with FLP/LFT profile. Aggressive management recommended with target LDL 70 or less, if feasible.

## 2011-11-24 NOTE — Assessment & Plan Note (Signed)
Maintaining NSR by recent EKG, as well as by PE. Continue beta blocker and low-dose ASA. Patient completed postop course of amiodarone in January.

## 2011-11-28 ENCOUNTER — Ambulatory Visit: Payer: Medicare Other | Admitting: Physician Assistant

## 2011-11-28 NOTE — Telephone Encounter (Signed)
Patient was in hospital already for this and has seen Gene in interim. NTD

## 2011-12-02 ENCOUNTER — Telehealth: Payer: Self-pay | Admitting: *Deleted

## 2011-12-02 DIAGNOSIS — E785 Hyperlipidemia, unspecified: Secondary | ICD-10-CM

## 2011-12-02 DIAGNOSIS — Z79899 Other long term (current) drug therapy: Secondary | ICD-10-CM

## 2011-12-02 MED ORDER — ATORVASTATIN CALCIUM 20 MG PO TABS: 20.0000 mg | ORAL_TABLET | Freq: Every day | ORAL | Status: DC

## 2011-12-02 NOTE — Telephone Encounter (Signed)
Message copied by Eustace Moore on Fri Dec 02, 2011 10:20 AM ------      Message from: Rande Brunt      Created: Wed Nov 30, 2011 11:40 AM       LDL 84. Finish current Mevacor supply, then start Lipitor 20 daily. Then, FLP/LFT profile in 12 weeks

## 2011-12-02 NOTE — Telephone Encounter (Signed)
Patient informed. New prescription sent to [facility]. Lab order put in computer and staff message sent to [facility] for 02/24/12.

## 2012-02-27 ENCOUNTER — Other Ambulatory Visit: Payer: Self-pay | Admitting: *Deleted

## 2012-02-27 ENCOUNTER — Encounter: Payer: Self-pay | Admitting: Neurosurgery

## 2012-02-27 DIAGNOSIS — E785 Hyperlipidemia, unspecified: Secondary | ICD-10-CM

## 2012-02-27 DIAGNOSIS — Z79899 Other long term (current) drug therapy: Secondary | ICD-10-CM

## 2012-02-28 ENCOUNTER — Ambulatory Visit (INDEPENDENT_AMBULATORY_CARE_PROVIDER_SITE_OTHER): Payer: Medicare Other | Admitting: Vascular Surgery

## 2012-02-28 ENCOUNTER — Other Ambulatory Visit: Payer: Medicare Other

## 2012-02-28 ENCOUNTER — Ambulatory Visit (INDEPENDENT_AMBULATORY_CARE_PROVIDER_SITE_OTHER): Payer: Medicare Other | Admitting: Neurosurgery

## 2012-02-28 ENCOUNTER — Encounter: Payer: Self-pay | Admitting: Neurosurgery

## 2012-02-28 VITALS — BP 154/93 | HR 58 | Resp 16 | Ht 71.0 in | Wt 172.9 lb

## 2012-02-28 DIAGNOSIS — I714 Abdominal aortic aneurysm, without rupture: Secondary | ICD-10-CM

## 2012-02-28 NOTE — Addendum Note (Signed)
Addended by: Sharee Pimple on: 02/28/2012 11:28 AM   Modules accepted: Orders

## 2012-02-28 NOTE — Progress Notes (Signed)
[facility] [place] AAA/PAD/PVD Office Note  CC: Six-month AAA surveillance Referring Physician: Imogene Burn  History of Present Illness: 71 year old male patient of Dr. Nicky Pugh with known AAA. The patient denies any unusual abdominal or back pain. The patient states he did have a fracture in his spine and was hospitalized for short period time for evaluation of back and chest pain and was told that the pain was radiating from the fracture.  Past Medical History  Diagnosis Date  . COPD (chronic obstructive pulmonary disease)   . Hypertension   . Hyperlipidemia   . Ulcer   . Reflux   . AAA (abdominal aortic aneurysm)   . CAD (coronary artery disease)     cath 11.30.2012 - 3vd  . Bilateral cataracts   . Retinal detachment     history of right detachment  . Angina   . Cancer     SPOT ON LUNG NOT DIAGNOSTED AS CANCER KEEPING AN EYE ON IT  . CHF (congestive heart failure)   . GERD (gastroesophageal reflux disease)   . Hiatal hernia   . Depression   . Status post coronary artery bypass grafting     surgery May 24, 2011 by Dr. Dorris Fetch.  [name] Postoperative atrial fibrillation     amiodarone therapy    ROS: [x]  Positive   [ ]  Denies    General: [ ]  Weight loss, [ ]  Fever, [ ]  chills Neurologic: [ ]  Dizziness, [ ]  Blackouts, [ ]  Seizure [ ]  Stroke, [ ]  "Mini stroke", [ ]  Slurred speech, [ ]  Temporary blindness; [ ]  weakness in arms or legs, [ ]  Hoarseness Cardiac: [ ]  Chest pain/pressure, [ ]  Shortness of breath at rest [ ]  Shortness of breath with exertion, [ ]  Atrial fibrillation or irregular heartbeat Vascular: [ ]  Pain in legs with walking, [ ]  Pain in legs at rest, [ ]  Pain in legs at night,  [ ]  Non-healing ulcer, [ ]  Blood clot in vein/DVT,   Pulmonary: [ ]  Home oxygen, [ ]  Productive cough, [ ]  Coughing up blood, [ ]  Asthma,  [ ]  Wheezing Musculoskeletal:  [ ]  Arthritis, [ ]  Low back pain, [ ]  Joint pain Hematologic: [ ]  Easy Bruising, [ ]  Anemia; [ ]   Hepatitis Gastrointestinal: [ ]  Blood in stool, [ ]  Gastroesophageal Reflux/heartburn, [ ]  Trouble swallowing Urinary: [ ]  chronic Kidney disease, [ ]  on HD - [ ]  MWF or [ ]  TTHS, [ ]  Burning with urination, [ ]  Difficulty urinating Skin: [ ]  Rashes, [ ]  Wounds Psychological: [ ]  Anxiety, [ ]  Depression   Social History History  Substance Use Topics  . Smoking status: Former Smoker -- 1.5 packs/day for 56 years    Types: Cigarettes    Quit date: 05/20/2011  . Smokeless tobacco: Never Used   Comment: DOES NOT WANT TO QUIT BUT HAS TOO  . Alcohol Use: 4.2 oz/week    7 Shots of liquor per week    Family History Family History  Problem Relation Age of Onset  . Heart disease Father   . Heart disease Sister   . Heart disease Brother     Allergies  Allergen Reactions  . Isosorbide Mononitrate     SEVER HEAD ACH ; PT COULD NOT SLEEP FOR 2 DAYS  . Penicillins Rash    Current Outpatient Prescriptions  Medication Sig Dispense Refill  . aspirin 81 MG tablet Take 81 mg by mouth daily.       [name] atenolol (  TENORMIN) 50 MG tablet Take 50 mg by mouth daily.        [name] atorvastatin (LIPITOR) 20 MG tablet Take 1 tablet (20 mg total) by mouth daily. Start when lovastatin supply finishes  30 tablet  6  . calcitonin, salmon, (MIACALCIN/FORTICAL) 200 UNIT/ACT nasal spray Place 1 spray into the nose daily. Rotates left & right daily      . calcium carbonate (OS-CAL) 600 MG TABS Take 600 mg by mouth 2 (two) times daily.      . citalopram (CELEXA) 20 MG tablet Take 20 mg by mouth daily.        . nitroGLYCERIN (NITROSTAT) 0.4 MG SL tablet Place 0.4 mg under the tongue every 5 (five) minutes as needed.      . Omeprazole 20 MG TBEC Take 20 mg by mouth daily.        . SYMBICORT 160-4.5 MCG/ACT inhaler 1 puff 2 (two) times daily.         Physical Examination  Filed Vitals:   02/28/12 1019  BP: 154/93  Pulse: 58  Resp: 16    Body mass index is 24.11 kg/(m^2).  General:  WDWN in NAD Gait:  Normal HEENT: WNL Eyes: Pupils equal Pulmonary: normal non-labored breathing , without Rales, rhonchi,  wheezing Cardiac: RRR, without  Murmurs, rubs or gallops; No carotid bruits Abdomen: soft, NT, no masses Skin: no rashes, ulcers noted Vascular Exam/Pulses: 3+ radial pulses bilaterally, very slightly pulsating abdominal mass noted  Extremities without ischemic changes, no Gangrene , no cellulitis; no open wounds;  Musculoskeletal: no muscle wasting or atrophy  Neurologic: A&O X 3; Appropriate Affect ; SENSATION: normal; MOTOR FUNCTION:  moving all extremities equally. Speech is fluent/normal  Non-Invasive Vascular Imaging: AAA duplex today shows a maximum diameter of 4.8, previous reading was 4.1 in March 2013.  Right common iliac artery is 1.62 x 1.62, left CIA is 1.54 x 1.47  ASSESSMENT/PLAN: Asymptomatic patient with slightly advanced AAA. The patient will followup in 6 months with repeat duplex. The patient's questions were encouraged and answered, he is in agreement with this plan.  Lauree Chandler ANP  Clinic M.D.: [facility]

## 2012-02-28 NOTE — Progress Notes (Signed)
Abdominal aorta duplex performed @ [facility] 02/28/2012

## 2012-03-05 ENCOUNTER — Telehealth: Payer: Self-pay | Admitting: *Deleted

## 2012-03-05 MED ORDER — ATORVASTATIN CALCIUM 40 MG PO TABS: 40.0000 mg | ORAL_TABLET | Freq: Every evening | ORAL | Status: DC

## 2012-03-05 NOTE — Telephone Encounter (Signed)
Message copied by Lesle Chris on Mon Mar 05, 2012 12:00 PM ------      Message from: Rande Brunt      Created: Fri Mar 02, 2012  1:24 PM       Then would increase Lipitor to 40 daily, and reassess lipids in 12 weeks            Gene                  ----- Message -----         From: Lesle Chris, LPN         Sent: 03/01/2012  10:10 AM           To: Prescott Parma, PA            Patient notified.  States he is already on the Lipitor 20mg  daily.  He was previously on Lovastatin at last OV in June.  At that visit, he was instructed to finish current supply of his Lovastatin, then change to Lipitor 20mg .  He did this not long after OV as he did not have many tabs left on old rx.  Please advise if he should continue at same dose or increase.

## 2012-03-05 NOTE — Telephone Encounter (Signed)
Notes Recorded by Lesle Chris, LPN on 09/26/8117 at 12:00 PM Wife Steward Drone) notified of below. Will send new rx to [facility]. Will mail reminder when time to repeat labs.

## 2012-03-14 NOTE — Progress Notes (Signed)
This encounter was created in error - please disregard.

## 2012-05-24 ENCOUNTER — Ambulatory Visit: Payer: Medicare Other | Admitting: Cardiology

## 2012-05-24 ENCOUNTER — Encounter: Payer: Self-pay | Admitting: Physician Assistant

## 2012-05-24 ENCOUNTER — Ambulatory Visit (INDEPENDENT_AMBULATORY_CARE_PROVIDER_SITE_OTHER): Payer: Medicare Other | Admitting: Physician Assistant

## 2012-05-24 VITALS — BP 129/84 | HR 70 | Ht 71.0 in | Wt 178.0 lb

## 2012-05-24 DIAGNOSIS — I519 Heart disease, unspecified: Secondary | ICD-10-CM

## 2012-05-24 DIAGNOSIS — I2510 Atherosclerotic heart disease of native coronary artery without angina pectoris: Secondary | ICD-10-CM

## 2012-05-24 DIAGNOSIS — Z720 Tobacco use: Secondary | ICD-10-CM

## 2012-05-24 DIAGNOSIS — I4891 Unspecified atrial fibrillation: Secondary | ICD-10-CM

## 2012-05-24 DIAGNOSIS — E785 Hyperlipidemia, unspecified: Secondary | ICD-10-CM

## 2012-05-24 DIAGNOSIS — Z79899 Other long term (current) drug therapy: Secondary | ICD-10-CM

## 2012-05-24 DIAGNOSIS — I714 Abdominal aortic aneurysm, without rupture, unspecified: Secondary | ICD-10-CM

## 2012-05-24 DIAGNOSIS — E782 Mixed hyperlipidemia: Secondary | ICD-10-CM

## 2012-05-24 DIAGNOSIS — F17200 Nicotine dependence, unspecified, uncomplicated: Secondary | ICD-10-CM

## 2012-05-24 NOTE — Assessment & Plan Note (Signed)
Quiescent by history and exam. Continue therapy with beta blocker and low-dose ASA.

## 2012-05-24 NOTE — Progress Notes (Signed)
Primary Cardiologist: Lewayne Bunting, MD   HPI: Scheduled six-month followup.  Patient denies any interim exertional CP or exacerbation of mild, chronic DOE. Of note, he has significantly cut back on cigarette smoking, currently down to an occasional cigarette a month. He attributes some of this success to his recent use of E-cigarettes.  Allergies  Allergen Reactions  . Isosorbide Mononitrate     SEVER HEAD ACH ; PT COULD NOT SLEEP FOR 2 DAYS  . Penicillins Rash  . Ambien (Zolpidem Tartrate)     hallucination       Current Outpatient Prescriptions  Medication Sig Dispense Refill  . aspirin 81 MG tablet Take 81 mg by mouth daily.       [name] atenolol (TENORMIN) 50 MG tablet Take 50 mg by mouth daily.        [name] atorvastatin (LIPITOR) 40 MG tablet Take 1 tablet (40 mg total) by mouth every evening.  30 tablet  6  . calcitonin, salmon, (MIACALCIN/FORTICAL) 200 UNIT/ACT nasal spray Place 1 spray into the nose daily. Rotates left & right daily      . calcium carbonate (OS-CAL) 600 MG TABS Take 600 mg by mouth 2 (two) times daily.      . citalopram (CELEXA) 20 MG tablet Take 20 mg by mouth daily.        . nitroGLYCERIN (NITROSTAT) 0.4 MG SL tablet Place 0.4 mg under the tongue every 5 (five) minutes as needed.      . Omeprazole 20 MG TBEC Take 20 mg by mouth daily.        . SYMBICORT 160-4.5 MCG/ACT inhaler 1 puff 2 (two) times daily.         Past Medical History  Diagnosis Date  . COPD (chronic obstructive pulmonary disease)   . Hypertension   . Hyperlipidemia   . Ulcer   . Reflux   . AAA (abdominal aortic aneurysm)   . CAD (coronary artery disease)     cath 11.30.2012 - 3vd  . Bilateral cataracts   . Retinal detachment     history of right detachment  . Angina   . Cancer     SPOT ON LUNG NOT DIAGNOSTED AS CANCER KEEPING AN EYE ON IT  . CHF (congestive heart failure)   . GERD (gastroesophageal reflux disease)   . Hiatal hernia   . Depression   . Status post coronary artery  bypass grafting     surgery May 24, 2011 by Dr. Dorris Fetch.  [name] Postoperative atrial fibrillation     amiodarone therapy    Past Surgical History  Procedure Date  . Lung surgery 1964  . Nose surgery   . Stint 1992    Stint placement  . Coronary angioplasty   . Eye surgery     [name] DETACHED HAD SURGERY FOR IT  . Coronary artery bypass graft 05/24/2011    Procedure: CORONARY ARTERY BYPASS GRAFTING (CABG);  Surgeon: Loreli Slot, MD;  Location[facility];  Service: Open Heart Surgery;  Laterality: N/A;  Coronary Artery Bypass Graft on pump times five, utlizing left internal mammary artery and right saphenous vein harvested endoscopically   . Spine surgery     Fx. Back  . Fracture surgery     Back    History   Social History  . Marital Status: Married    Spouse Name: N/A    Number of Children: N/A  . Years of Education: N/A   Occupational History  . Not on file.  Social History Main Topics  . Smoking status: Former Smoker -- 1.5 packs/day for 56 years    Types: Cigarettes    Quit date: 05/20/2011  . Smokeless tobacco: Never Used     Comment: Smoking a electronic cigarette  . Alcohol Use: 4.2 oz/week    7 Shots of liquor per week  . Drug Use: No  . Sexually Active: Yes   Other Topics Concern  . Not on file   Social History Narrative  . No narrative on file   Social History Narrative  . No narrative on file    Problem Relation Age of Onset  . Heart disease Father   . Heart disease Sister   . Heart disease Brother     ROS: no nausea, vomiting; no fever, chills; no melena, hematochezia; no claudication  PHYSICAL EXAM: BP 129/84  Pulse 70  Ht 5\' 11"  (1.803 m)  Wt 178 lb (80.74 kg)  BMI 24.83 kg/m2  SpO2 98% GENERAL: 71 year old male, sitting upright; NAD  HEENT: NCAT, PERRLA, EOMI; sclera clear; no xanthelasma  NECK: palpable bilateral carotid pulses, no bruits; no JVD; no TM  LUNGS: Diminished breath sounds  CARDIAC: RRR (S1, S2); no  significant murmurs; no rubs or gallops  ABDOMEN: soft, non-tender; intact BS  EXTREMETIES: no significant peripheral edema  SKIN: warm/dry; no obvious rash/lesions  MUSCULOSKELETAL: no joint deformity  NEURO: no focal deficit; NL affect    EKG:    ASSESSMENT & PLAN:  Coronary artery disease Quiescent on current medication regimen. He has elected to maintain followup with our team here in the [facility] clinic, and we will schedule a return visit in one year, at which time he will establish with Dr. Antoine Poche.  AAA (abdominal aortic aneurysm) Followed by [facility] in [place]  Dyslipidemia Reassess lipid status with a FLP, following recent substitution of Mevacor with Lipitor (LDL 82). Aggressive management recommended with target LDL 70 or less, if feasible.  Postoperative atrial fibrillation Quiescent by history and exam. Continue therapy with beta blocker and low-dose ASA.  Tobacco abuse Patient appears highly motivated to stop smoking altogether.  Gene Gurshan Settlemire, PAC

## 2012-05-24 NOTE — Assessment & Plan Note (Signed)
Reassess lipid status with a FLP, following recent substitution of Mevacor with Lipitor (LDL 82). Aggressive management recommended with target LDL 70 or less, if feasible.

## 2012-05-24 NOTE — Assessment & Plan Note (Signed)
Quiescent on current medication regimen. He has elected to maintain followup with our team here in the [facility] clinic, and we will schedule a return visit in one year, at which time he will establish with Dr. Antoine Poche.

## 2012-05-24 NOTE — Assessment & Plan Note (Signed)
Followed by [facility] in [place] 

## 2012-05-24 NOTE — Assessment & Plan Note (Signed)
Patient appears highly motivated to stop smoking altogether.

## 2012-05-24 NOTE — Patient Instructions (Signed)
Continue all current medications. Labs for fasting lipid and liver panel Office will notify of results Your physician wants you to follow up in:  1 year.  You will receive a reminder letter in the mail one-two months in advance.  If you don't receive a letter, please call our office to schedule the follow up appointment

## 2012-05-29 ENCOUNTER — Telehealth: Payer: Self-pay | Admitting: *Deleted

## 2012-05-29 NOTE — Telephone Encounter (Signed)
Notes Recorded by Lesle Chris, LPN on 16/03/9603 at 3:45 PM Patient notified.

## 2012-05-29 NOTE — Telephone Encounter (Signed)
Message copied by Lesle Chris on Tue May 29, 2012  3:45 PM ------      Message from: Rande Brunt      Created: Tue May 29, 2012 10:02 AM       LDL improved slightly to 79, but HDL 75! Continue current medication regimen.

## 2012-08-28 ENCOUNTER — Ambulatory Visit: Payer: Medicare Other | Admitting: Neurosurgery

## 2012-08-30 ENCOUNTER — Encounter: Payer: Self-pay | Admitting: Vascular Surgery

## 2012-08-31 ENCOUNTER — Ambulatory Visit (INDEPENDENT_AMBULATORY_CARE_PROVIDER_SITE_OTHER): Payer: Medicare Other | Admitting: Vascular Surgery

## 2012-08-31 ENCOUNTER — Ambulatory Visit: Payer: Medicare Other | Admitting: Neurosurgery

## 2012-08-31 ENCOUNTER — Encounter (INDEPENDENT_AMBULATORY_CARE_PROVIDER_SITE_OTHER): Payer: Medicare Other | Admitting: *Deleted

## 2012-08-31 ENCOUNTER — Encounter: Payer: Self-pay | Admitting: Vascular Surgery

## 2012-08-31 VITALS — BP 136/87 | HR 67 | Ht 71.0 in | Wt 179.0 lb

## 2012-08-31 DIAGNOSIS — I714 Abdominal aortic aneurysm, without rupture: Secondary | ICD-10-CM

## 2012-08-31 NOTE — Progress Notes (Signed)
[facility] [address]  Established Abdominal Aortic Aneurysm  History of Present Illness  The patient is a 72 y.o. (07/10/1940) male who presents with chief complaint: follow up for AAA.  Previous studies demonstrate an AAA, measuring 4.9 x 4.9 cm.  The patient does not have abdominal pain but chronically has back pain from known spinal degeneration.  The patient is a smoker but has sharply decreased his use.  The patient's PMH, PSH, SH, FamHx, Med, Allergies and ROS are unchanged from 02/28/12.  Physical Examination  Filed Vitals:   08/31/12 0928  BP: 136/87  Pulse: 67  Height: 5\' 11"  (1.803 m)  Weight: 179 lb (81.194 kg)  SpO2: 100%   Body mass index is 24.98 kg/(m^2).  General: A&O x 3, WDWN  Pulmonary: Sym exp, good air movt, CTAB, no rales, rhonchi, & wheezing  Cardiac: RRR, Nl S1, S2, no Murmurs, rubs or gallops  Vascular: Vessel Right Left  Radial Palpable Palpable  Ulnar Palpable Palpable  Brachial Palpable Palpable  Carotid Palpable, without bruit Palpable, without bruit  Aorta Not palpable N/A  Femoral Palpable Palpable  Popliteal Not palpable Not palpable  PT Faintly Palpable Faintly Palpable  DP Faintly Palpable Faintly Palpable   Gastrointestinal: soft, NTND, -G/R, - HSM, - masses, - CVAT B  Musculoskeletal: M/S 5/5 throughout , Extremities without ischemic changes   Neurologic: Pain and light touch intact in extremities , Motor exam as listed above  Non-Invasive Vascular Imaging  AAA Duplex (Date: 08/31/12)  Previous size: 4.9 cm x 4.9 cm (Date: 02/28/12)  Current size:  4.5 cm x 4.7 cm   Medical Decision Making  The patient is a 72 y.o. male who presents with: asx moderate size AAA.   Based on this patient's exam and diagnostic studies, he needs q6 month aortic surveillance with AAA duplex.  The threshold for repair is AAA size > 5.5 cm, growth > 1 cm/yr, and symptomatic status.  I emphasized the importance of maximal  medical management including strict control of blood pressure, blood glucose, and lipid levels, antiplatelet agents, obtaining regular exercise, and cessation of smoking.    Thank you for allowing [place] to participate in this patient's care.  Leonides Sake, MD [facility] [place] Office: [phone] Pager: [phone]  08/31/2012, 9:55 AM

## 2012-08-31 NOTE — Addendum Note (Signed)
Addended by: Adria Dill L on: 08/31/2012 10:27 AM   Modules accepted: Orders

## 2012-09-21 ENCOUNTER — Other Ambulatory Visit: Payer: Self-pay | Admitting: Physician Assistant

## 2012-11-22 ENCOUNTER — Encounter (INDEPENDENT_AMBULATORY_CARE_PROVIDER_SITE_OTHER): Payer: Self-pay | Admitting: *Deleted

## 2012-12-19 IMAGING — CR DG CHEST 2V
2 series · 2 of 2 positions shown · non-contrast
Comparison: 06/06/2006

CLINICAL DATA: Coronary artery disease, preoperative assessment for
CABG

CHEST - 2 VIEW

[w chest pa]
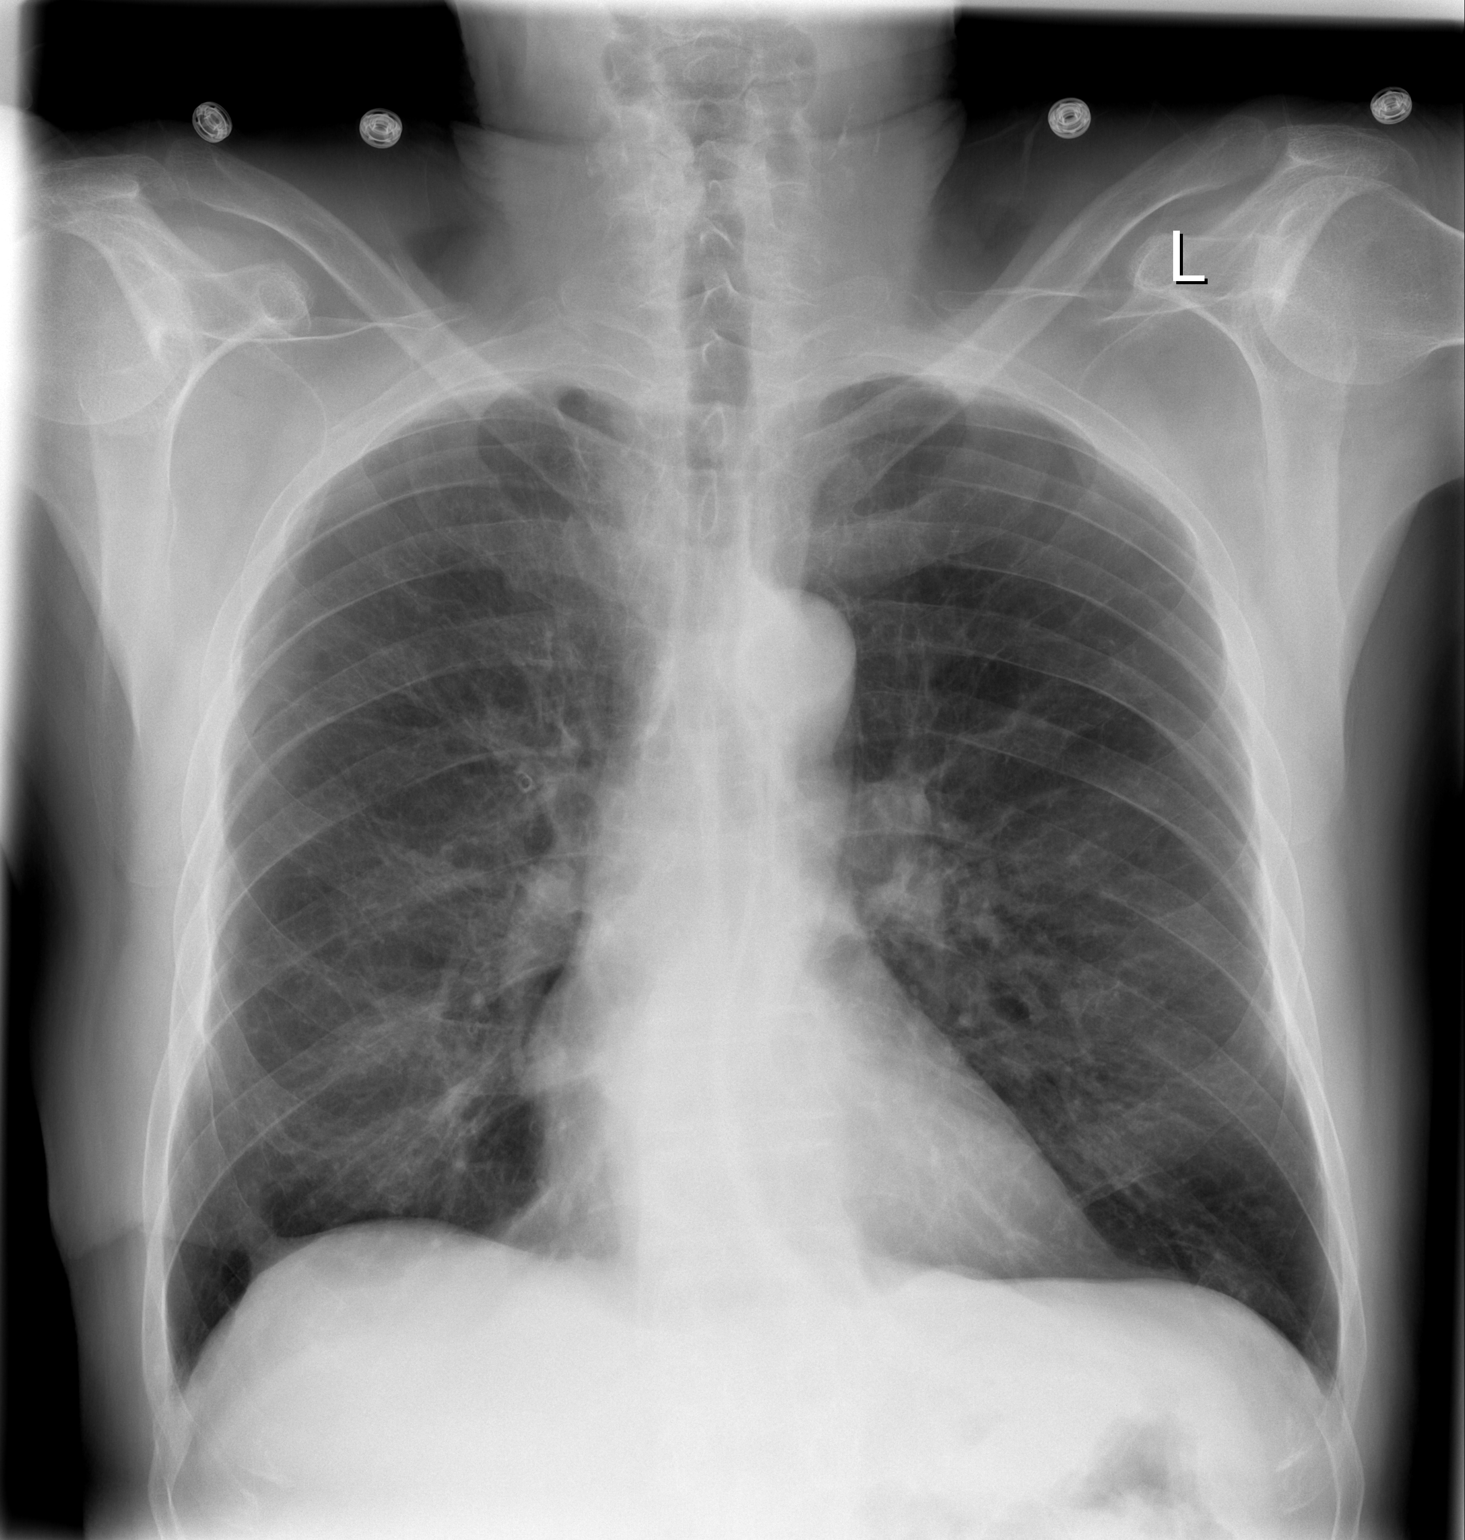

[w chest lat]
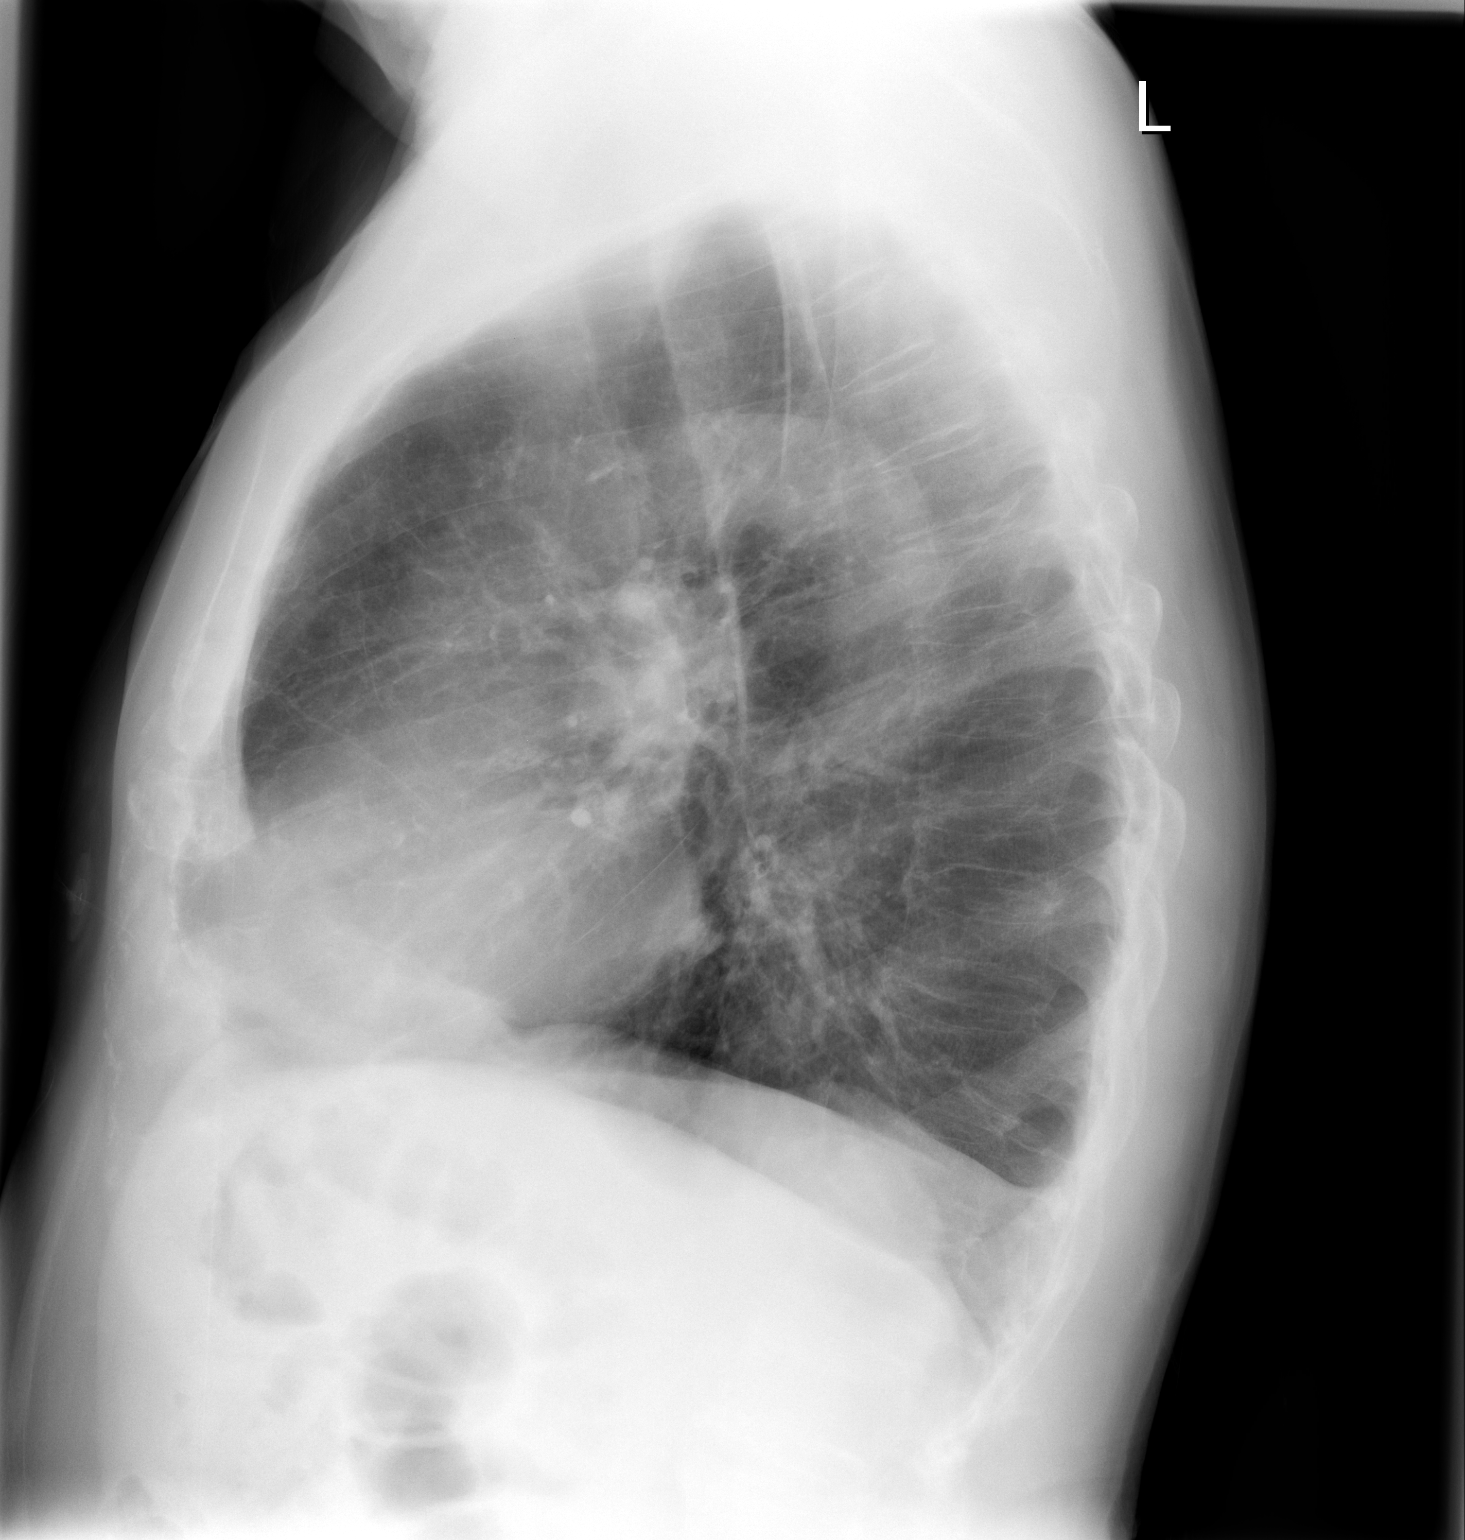

[2 of 2 positions shown; findings below may reference images not displayed]

FINDINGS: Normal heart size and pulmonary vascularity.
Tortuous aorta.
Emphysematous and bronchitic changes.
No pulmonary infiltrate, pleural effusion, or pneumothorax.
Bones are diffusely demineralized.
IMPRESSION: Mild emphysematous and chronic bronchitic changes.
No acute abnormalities.

## 2012-12-21 IMAGING — CR DG CHEST 1V PORT
1 series · 1 of 1 positions shown · non-contrast
Comparison: Portable chest x-ray of 05/24/2011

CLINICAL DATA: Postop heart surgery

PORTABLE CHEST - 1 VIEW

[view not recorded]
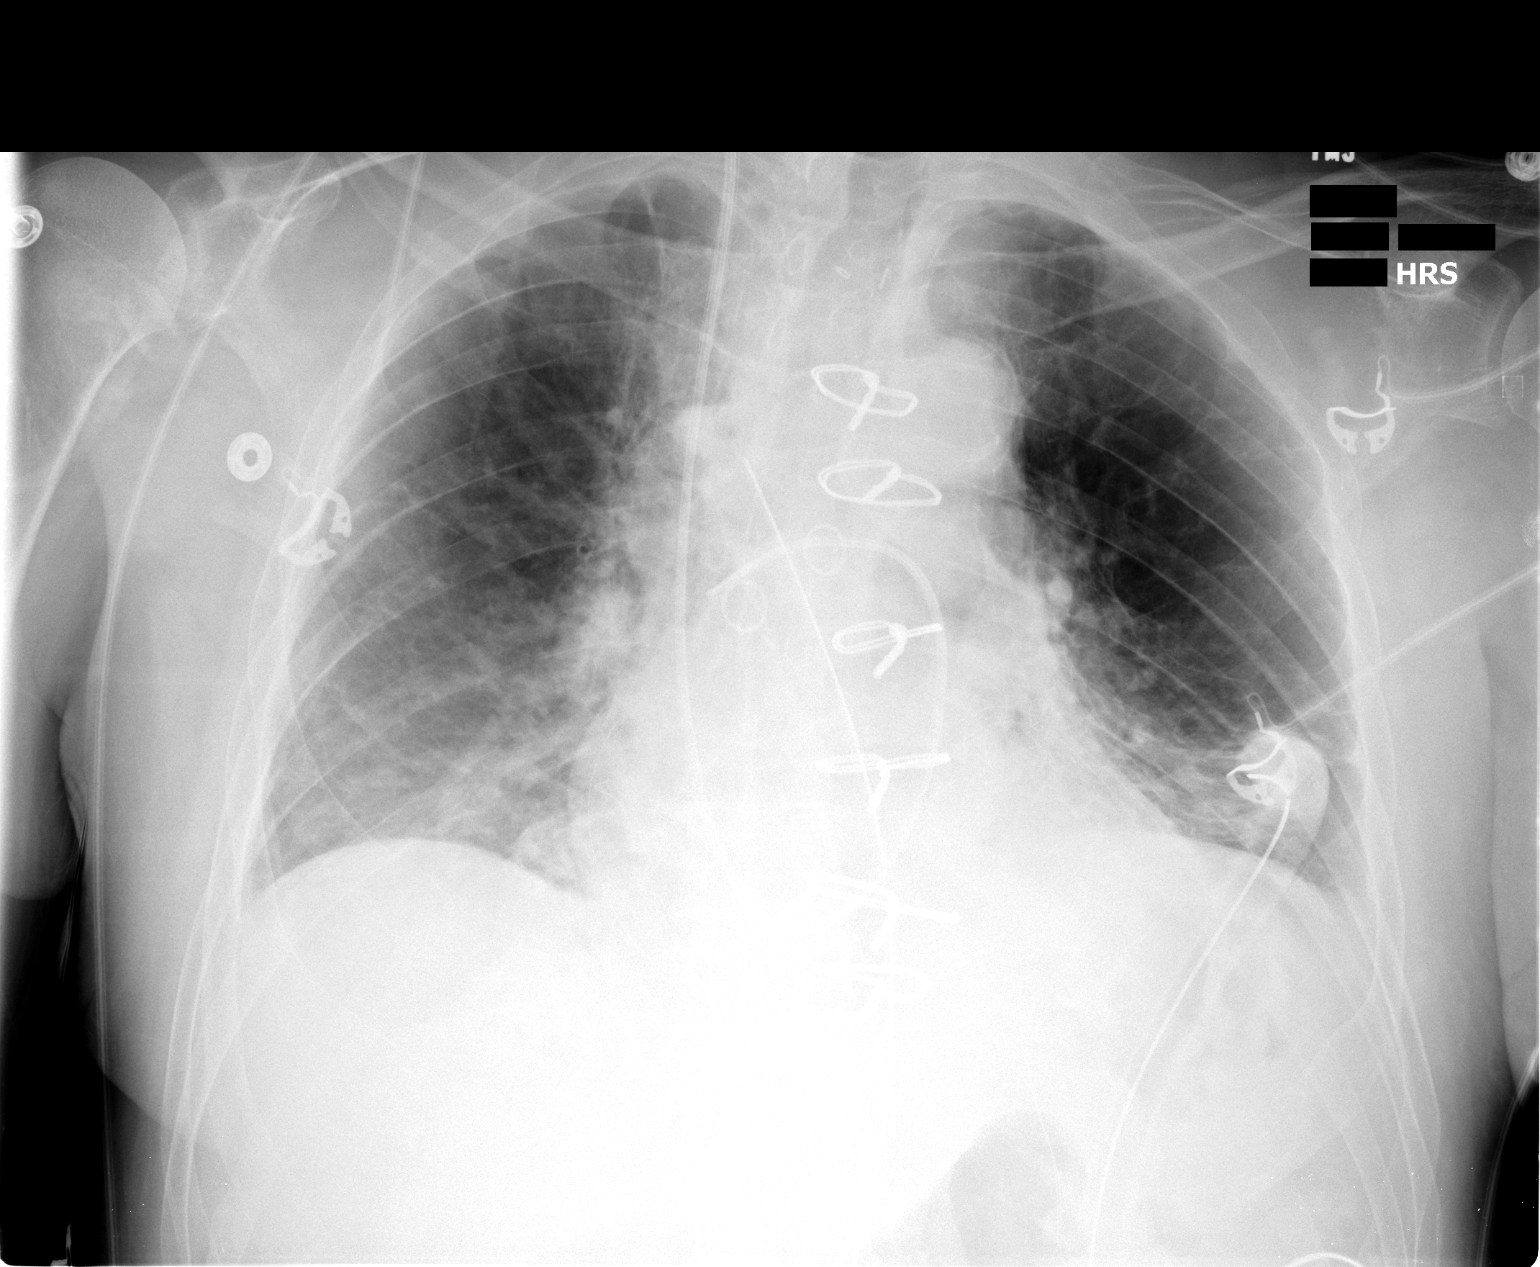

[1 of 1 positions shown; findings below may reference images not displayed]

FINDINGS: The endotracheal tube has been removed.  There has been
decrease in aeration with increase in basilar atelectasis.  Mild
cardiomegaly is stable.  Swan-Ganz catheter is unchanged in
position.
IMPRESSION: Endotracheal tube removed.  Increase in bibasilar linear
atelectasis.

## 2012-12-23 IMAGING — CR DG CHEST 2V
2 series · 2 of 2 positions shown · non-contrast
Comparison: 05/26/2011.

CLINICAL DATA: Post CABG.

CHEST - 2 VIEW

[w chest pa]
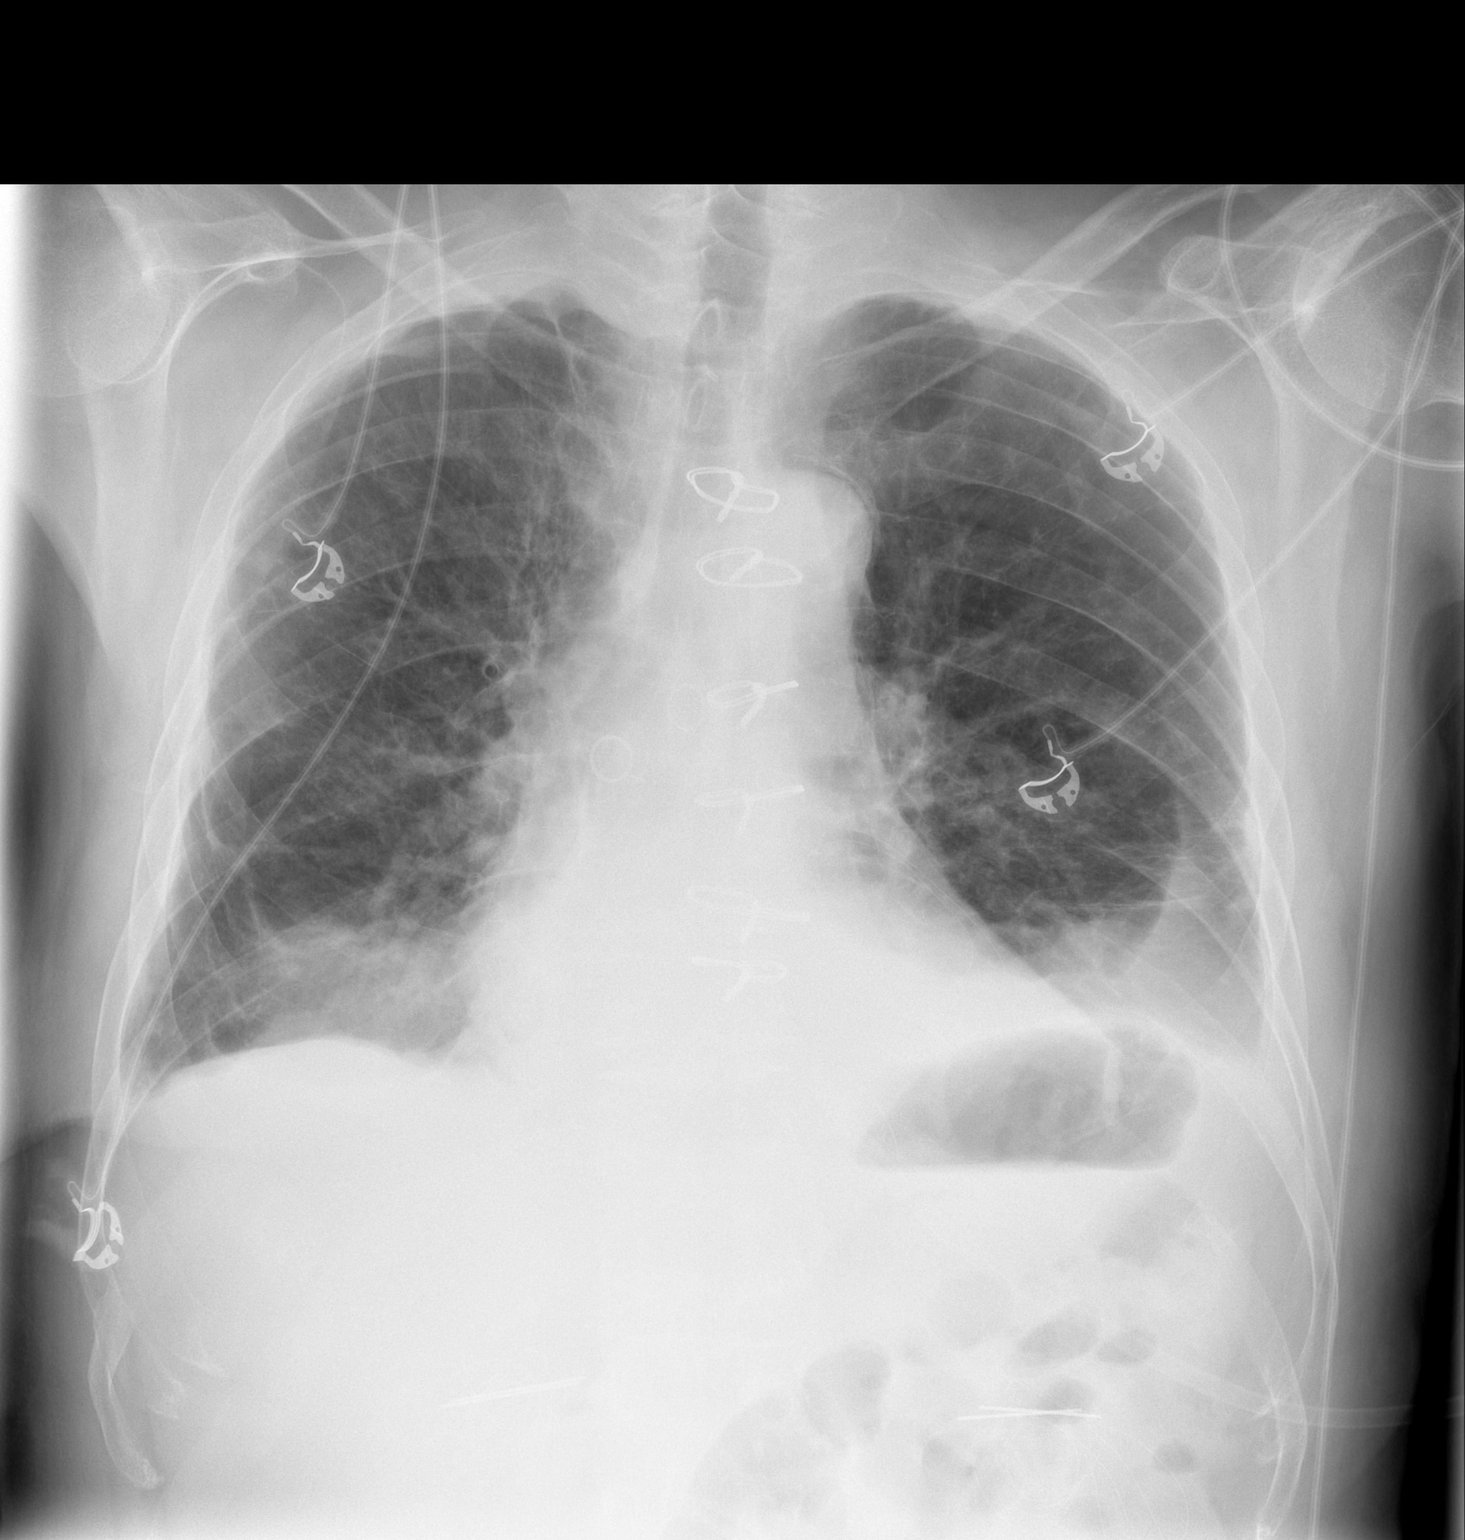

[w chest lat]
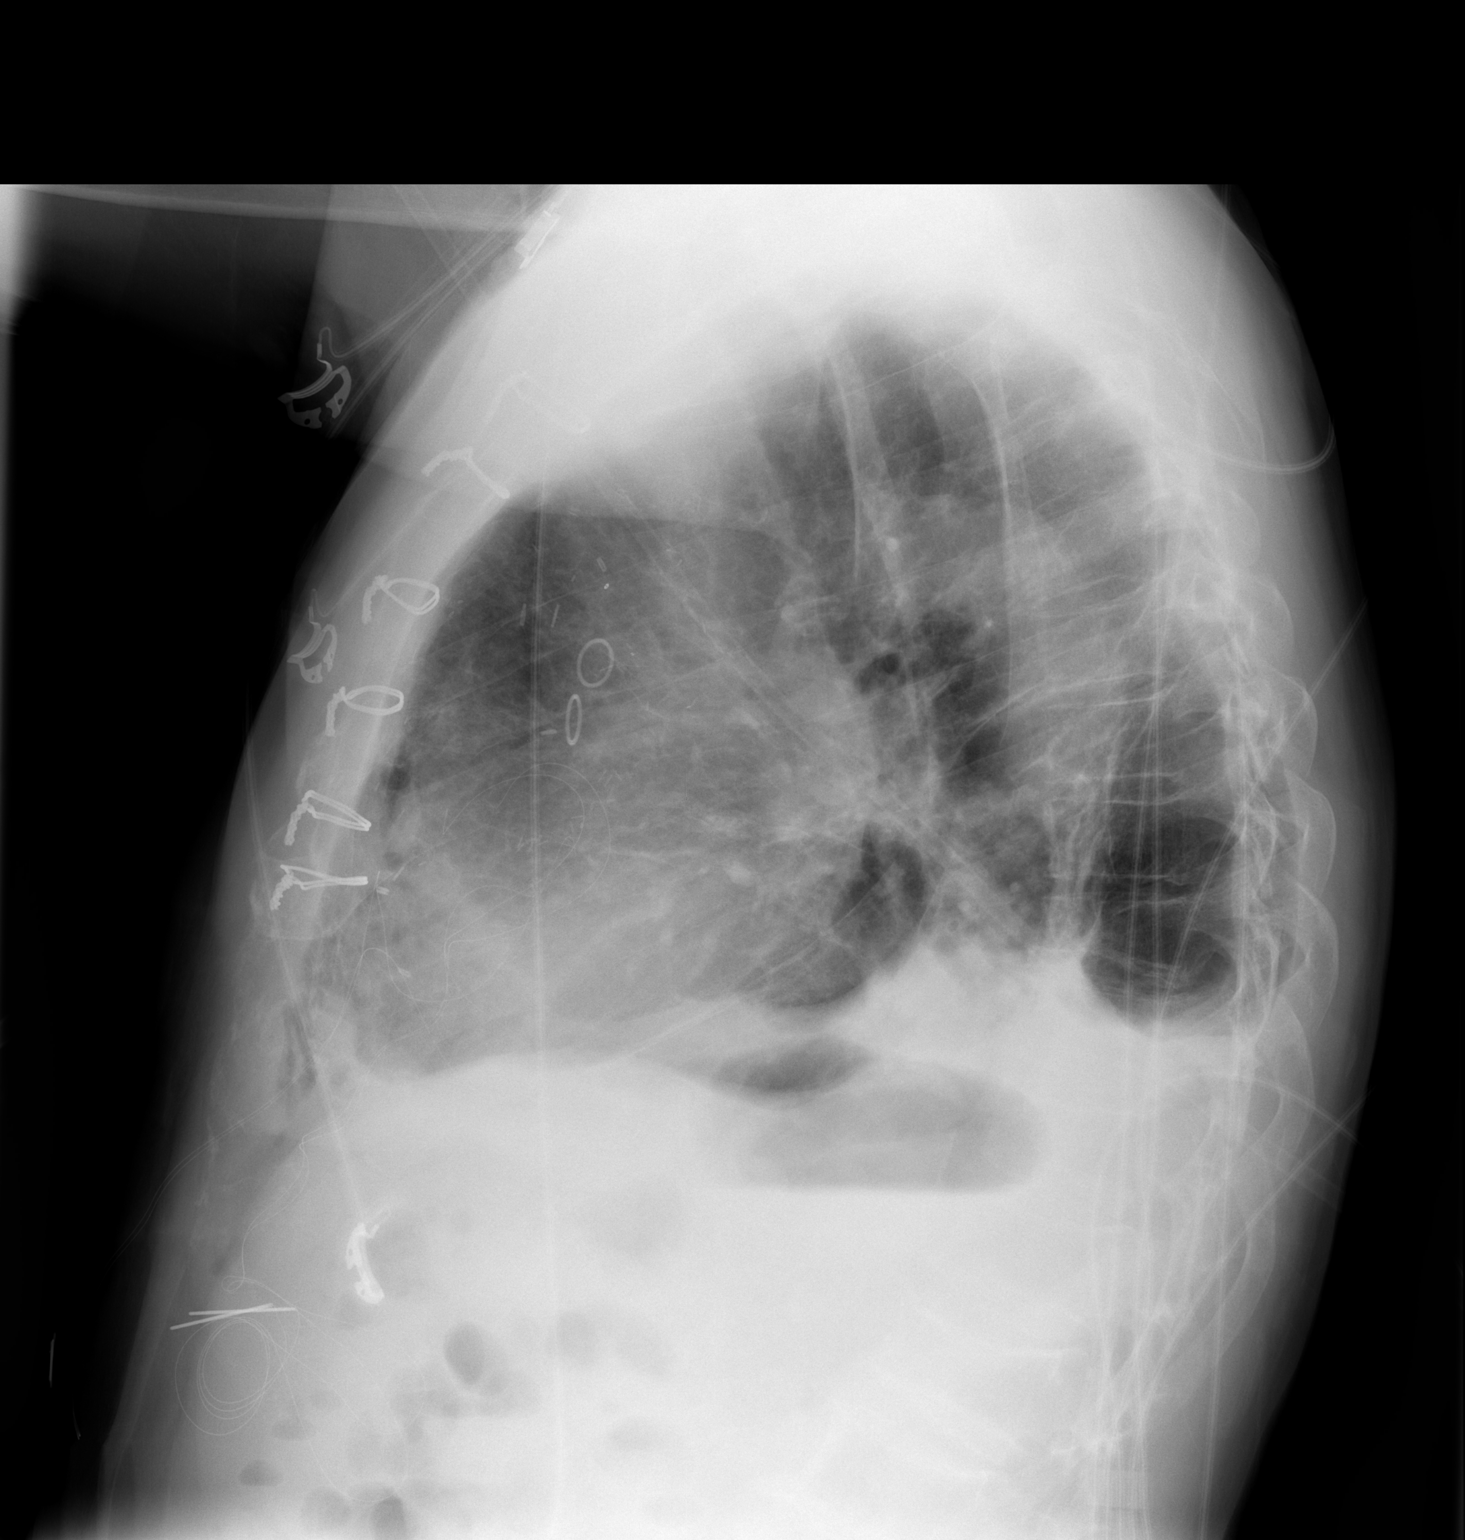

[2 of 2 positions shown; findings below may reference images not displayed]

FINDINGS: Post CABG.  Tiny medial left pneumothorax or
pneumomediastinum may be present as versus atelectatic changes
adjacent to the aorta.

Bilateral pleural effusions and basilar atelectasis.  Epicardial
leads remain in place.

Compression deformity T12 and L1.

Cardiomegaly.  Pulmonary vascular congestion most notable
centrally.
IMPRESSION: Tiny left pneumothorax/pneumomediastinum suspected.

Bilateral pleural effusions and basilar atelectasis greater on the
left.

Cardiomegaly.

Pulmonary vascular prominence most notable centrally.

## 2013-02-14 ENCOUNTER — Ambulatory Visit (INDEPENDENT_AMBULATORY_CARE_PROVIDER_SITE_OTHER): Payer: Medicare Other | Admitting: Cardiology

## 2013-02-14 ENCOUNTER — Encounter: Payer: Self-pay | Admitting: Cardiology

## 2013-02-14 VITALS — BP 128/80 | HR 81 | Ht 71.0 in | Wt 174.0 lb

## 2013-02-14 DIAGNOSIS — I452 Bifascicular block: Secondary | ICD-10-CM

## 2013-02-14 DIAGNOSIS — I4891 Unspecified atrial fibrillation: Secondary | ICD-10-CM

## 2013-02-14 DIAGNOSIS — I519 Heart disease, unspecified: Secondary | ICD-10-CM

## 2013-02-14 MED ORDER — ATORVASTATIN CALCIUM 40 MG PO TABS: 40.0000 mg | ORAL_TABLET | Freq: Every day | ORAL | Status: DC

## 2013-02-14 NOTE — Patient Instructions (Addendum)

## 2013-02-14 NOTE — Progress Notes (Signed)
HPI The patient presents for evaluation of coronary disease. This is my first visit with him. He has had CABG. However, since surgery he says he does well. The patient denies any new symptoms such as chest discomfort, neck or arm discomfort. There has been no new shortness of breath, PND or orthopnea. There have been no reported palpitations, presyncope or syncope. He is active. He walks periodically downhill to look at some grapevines by the [facility]. Walking uphill he might get a little winded but he gets no chest discomfort. He unfortunately still smokes cigarettes though he is cutting back.  Allergies  Allergen Reactions  . Isosorbide Mononitrate     SEVER HEAD ACH ; PT COULD NOT SLEEP FOR 2 DAYS  . Penicillins Rash  . Ambien [Zolpidem Tartrate]     hallucination       Current Outpatient Prescriptions  Medication Sig Dispense Refill  . aspirin 81 MG tablet Take 81 mg by mouth daily.       [name] atenolol (TENORMIN) 50 MG tablet Take 50 mg by mouth daily.        [name] atorvastatin (LIPITOR) 40 MG tablet TAKE ONE TABLET BY MOUTH IN THE EVENING  30 tablet  8  . calcitonin, salmon, (MIACALCIN/FORTICAL) 200 UNIT/ACT nasal spray Place 1 spray into the nose daily. Rotates left & right daily      . calcium carbonate (OS-CAL) 600 MG TABS Take 600 mg by mouth 2 (two) times daily.      . citalopram (CELEXA) 20 MG tablet Take 20 mg by mouth daily.        [name] lovastatin (MEVACOR) 40 MG tablet Take 40 mg by mouth at bedtime.      . nitroGLYCERIN (NITROSTAT) 0.4 MG SL tablet Place 0.4 mg under the tongue every 5 (five) minutes as needed.      . Omeprazole 20 MG TBEC Take 20 mg by mouth daily.        . SYMBICORT 160-4.5 MCG/ACT inhaler 1 puff 2 (two) times daily.        No current facility-administered medications for this visit.    Past Medical History  Diagnosis Date  . COPD (chronic obstructive pulmonary disease)   . Hypertension   . Hyperlipidemia   . Ulcer   . Reflux   . AAA (abdominal aortic  aneurysm)   . CAD (coronary artery disease)     cath 11.30.2012 - 3vd  . Bilateral cataracts   . Retinal detachment     history of right detachment  . Angina   . Lung nodule     SPOT ON LUNG NOT DIAGNOSTED AS CANCER KEEPING AN EYE ON IT  . CHF (congestive heart failure)   . GERD (gastroesophageal reflux disease)   . Hiatal hernia   . Depression   . Status post coronary artery bypass grafting     surgery May 24, 2011 by Dr. Dorris Fetch.  [name] Postoperative atrial fibrillation     amiodarone therapy  . Back fracture     Past Surgical History  Procedure Laterality Date  . Lung surgery  1964  . Nose surgery    . Coronary angioplasty  1992    Stent  . Eye surgery      [name] DETACHED HAD SURGERY FOR IT  . Coronary artery bypass graft  05/24/2011    Procedure: CORONARY ARTERY BYPASS GRAFTING (CABG);  Surgeon: Loreli Slot, MD;  Location: [facility] OR;  Service: Open Heart Surgery;  Laterality: N/A;  Coronary Artery  Bypass Graft on pump times five, utlizing left internal mammary artery and right saphenous vein harvested endoscopically     ROS:  As stated in the HPI and negative for all other systems.  PHYSICAL EXAM BP 128/80  Pulse 81  Ht 5\' 11"  (1.803 m)  Wt 174 lb (78.926 kg)  BMI 24.28 kg/m2 GENERAL:  Well appearing HEENT:  Pupils equal round and reactive, fundi not visualized, oral mucosa unremarkable NECK:  No jugular venous distention, waveform within normal limits, carotid upstroke brisk and symmetric, no bruits, no thyromegaly LYMPHATICS:  No cervical, inguinal adenopathy LUNGS:  Clear to auscultation bilaterally BACK:  No CVA tenderness CHEST:  Well healed sternotomy scar. HEART:  PMI not displaced or sustained,S1 and S2 within normal limits, no S3, no S4, no clicks, no rubs, no murmurs ABD:  Flat, positive bowel sounds normal in frequency in pitch, no bruits, no rebound, no guarding, no midline pulsatile mass, no hepatomegaly, no splenomegaly EXT:  2 plus pulses  throughout, no edema, no cyanosis no clubbing SKIN:  No rashes no nodules NEURO:  Cranial nerves II through XII grossly intact, motor grossly intact throughout PSYCH:  Cognitively intact, oriented to person place and time   EKG:  Sinus rhythm, right bundle branch block, rate 81, axis within normal limits.  02/14/2013   ASSESSMENT AND PLAN  AAA (abdominal aortic aneurysm)-   Followed by Dr. Imogene Burn.   Coronary artery disease/CABG:  He has had no new symptoms since his bypass. No change in therapy is indicated.  Hypertension:  The blood pressure is at target. No change in medications is indicated. We will continue with therapeutic lifestyle changes (TLC).  Tobacco abuse:  We discussed a specific strategy for tobacco cessation.   Dyslipidemia:  We had to call the pharmacy to clarify as he thought he was still taking Mevacor. However, he had been switched to Lipitor as was noted in the previous note. We've clarified and reviewed with him the med list and gave him an accurate up-to-date list. I did review the lipids that he had done in December and his LDL was 79. He will continue this.  General:  I reviewed extensively with him his past medical history and removed layers from his records including the report of lung cancer and back surgery. He does have lung nodules but has never been told he had cancer. He had distant lung resection but was told he was not cancer. He has a fractured back but no surgery.

## 2013-03-07 ENCOUNTER — Encounter: Payer: Self-pay | Admitting: Vascular Surgery

## 2013-03-08 ENCOUNTER — Ambulatory Visit (INDEPENDENT_AMBULATORY_CARE_PROVIDER_SITE_OTHER): Payer: Medicare Other | Admitting: Vascular Surgery

## 2013-03-08 ENCOUNTER — Encounter: Payer: Self-pay | Admitting: Vascular Surgery

## 2013-03-08 ENCOUNTER — Encounter (INDEPENDENT_AMBULATORY_CARE_PROVIDER_SITE_OTHER): Payer: Medicare Other | Admitting: *Deleted

## 2013-03-08 VITALS — BP 133/97 | HR 64 | Ht 71.0 in | Wt 173.0 lb

## 2013-03-08 DIAGNOSIS — I714 Abdominal aortic aneurysm, without rupture, unspecified: Secondary | ICD-10-CM

## 2013-03-08 DIAGNOSIS — Z48812 Encounter for surgical aftercare following surgery on the circulatory system: Secondary | ICD-10-CM

## 2013-03-08 DIAGNOSIS — I723 Aneurysm of iliac artery: Secondary | ICD-10-CM

## 2013-03-08 NOTE — Addendum Note (Signed)
Addended by: Adria Dill L on: 03/08/2013 10:31 AM   Modules accepted: Orders

## 2013-03-08 NOTE — Progress Notes (Signed)
[facility]   Established Abdominal Aortic Aneurysm  History of Present Illness  The patient is a 72 y.o. ([birth year]) Troy [name] who presents with chief complaint: follow up for AAA and iliac aneurysms.  Previous studies demonstrate an AAA, measuring 4.7 cm, R CIA: 1.6 cm, L CIA 1.7.  The patient does have back or abdominal pain.  His prior back pain has resolved.  The patient is active smoker but has convert to e-cig.  The patient's PMH, PSH, SH, FamHx, Med, and Allergies are unchanged from 08/31/12.  On ROS today: no abd pain, resolved neuropathic sx  Physical Examination  Filed Vitals:   03/08/13 0857  BP: 133/97  Pulse: 64  Height: 5\' 11"  (1.803 m)  Weight: 173 lb (78.472 kg)   Body mass index is 24.14 kg/(m^2).  General: A&O x 3, WDWN  Pulmonary: Sym exp, good air movt, CTAB, no rales, rhonchi, & wheezing  Cardiac: RRR, Nl S1, S2, no Murmurs, rubs or gallops Vascular:  Vessel  Right  Left   Radial  Palpable  Palpable   Ulnar  Palpable  Palpable   Brachial  Palpable  Palpable   Carotid  Palpable, without bruit  Palpable, without bruit   Aorta  Not palpable  N/A   Femoral  Palpable  Palpable   Popliteal  Not palpable  Not palpable   PT  Faintly Palpable  Faintly Palpable   DP  Faintly Palpable  Faintly Palpable    Gastrointestinal: soft, NTND, -G/R, - HSM, - masses, - CVAT B   Musculoskeletal: M/S 5/5 throughout , Extremities without ischemic changes   Neurologic: Pain and light touch intact in extremities , Motor exam as listed above  Non-Invasive Vascular Imaging  AAA Duplex (03/08/2013)  Previous size: 4.48 cm x 4.71 cm (Date: 08/31/12)  Current size:  4.41 cm x 4.59 cm (Date: 03/08/13)  R CIA: 1.65 cm x1.63 cm  L CIA: 1.50 cm x 1.53 cm  Medical Decision Making  The patient is a 72 y.o. Troy [name] who presents with: asymptomatic AAA with stable size, asx B CIA aneurysm with stable size.   Based on this patient's exam and diagnostic  studies, he needs q6 month AAA duplex.  The threshold for repair is AAA size > 5.5 cm, growth > 1 cm/yr, and symptomatic status.  I emphasized the importance of maximal medical management including strict control of blood pressure, blood glucose, and lipid levels, antiplatelet agents, obtaining regular exercise, and cessation of smoking.    The patient will follow up with my NP in 6 months for continued surveillance of his AAA in order to decrease risk of rupture.  Thank you for allowing [place] to participate in this patient's care.  Leonides Sake, MD [facility] [place] Office: [phone] Pager: [phone]  03/08/2013, 9:15 AM

## 2013-04-05 ENCOUNTER — Encounter: Payer: Self-pay | Admitting: Internal Medicine

## 2013-07-18 NOTE — H&P (Signed)
  NTS SOAP Note  Vital Signs:  Vitals as of: 12/14/9483: Systolic 462: Diastolic 91: Heart Rate 72: Temp 97.57F: Height 54ft 11in: Weight 170Lbs 0 Ounces: BMI 23.71  BMI : 23.71 kg/m2  Subjective: This 73 Years 74 Months old Male presents for of a basal cell carcinoma.  Has had shaved biopsy of the lesion by dermatology in past, incomplete resection noted.  Referred for formal excision.  Wound has been infected in past, currently on doxycycline.  Review of Symptoms:  Constitutional:unremarkable      headache Eyes:unremarkable   Nose/Mouth/Throat:unremarkable Cardiovascular:  unremarkable   Respiratory:  dyspnea Gastrointestinal:  unremarkable   Genitourinary:unremarkable     Musculoskeletal:unremarkable   dry skin Hematolgic/Lymphatic:unremarkable     Allergic/Immunologic:unremarkable     Past Medical History:    Reviewed  Past Medical History  Surgical History: lung surgery, CABG 2012 Medical Problems: CAD, COPD, HTN, high cholesterol Allergies: isosorbide, PCN, ambien Medications: ASA, atenolol, atorvastatin, celexa, prilosec, symbicort, NTG   Social History:Reviewed  Social History  Preferred Language: English Race:  White Ethnicity: Not Hispanic / Latino Age: 73 Years 4 Months Marital Status:  M Alcohol: rarely Recreational drug(s): no   Smoking Status: Current every day smoker reviewed on 07/16/2013 Started Date: 06/20/1959 Packs per day: 0.50 Functional Status reviewed on 07/16/2013 ------------------------------------------------ Bathing: Normal Cooking: Normal Dressing: Normal Driving: Normal Eating: Normal Managing Meds: Normal Oral Care: Normal Shopping: Normal Toileting: Normal Transferring: Normal Walking: Normal Cognitive Status reviewed on 07/16/2013 ------------------------------------------------ Attention: Normal Decision Making: Normal Language: Normal Memory: Normal Motor:  Normal Perception: Normal Problem Solving: Normal Visual and Spatial: Normal   Family History:  Reviewed  Family Health History Family History is Unknown    Objective Information: General:  Well appearing, well nourished in no distress.      4x3cm open wound over right scapula. Heart:  RRR, no murmur     Distant lung sounds, clear.  No wheezing currently.  Assessment:Basal cell carcinoma, skin, back  Diagnoses: 173.91 Basal cell carcinoma - primary (Basal cell carcinoma of skin, unspecified)  Procedures: 70350 - OFFICE OUTPATIENT NEW 30 MINUTES    Plan:Will call Dermatology.  Will not be able to close this primarily.  Offerred referral to tertiary care center, but patient fine with leaving wound open.  Will schedule surgery in near future.   Patient Education:Alternative treatments to surgery were discussed with patient (and family).  Risks and benefits  of procedure were fully explained to the patient (and family) who gave informed consent. Patient/family questions were addressed.  Follow-up:Pending Surgery

## 2013-07-24 ENCOUNTER — Encounter (HOSPITAL_COMMUNITY)
Admission: RE | Admit: 2013-07-24 | Discharge: 2013-07-24 | Disposition: A | Discharge status: Home / Self Care | Payer: Medicare Other | Source: Ambulatory Visit | Attending: General Surgery | Admitting: General Surgery

## 2013-07-24 ENCOUNTER — Encounter (HOSPITAL_COMMUNITY): Payer: Self-pay

## 2013-07-24 DIAGNOSIS — Z01812 Encounter for preprocedural laboratory examination: Secondary | ICD-10-CM | POA: Insufficient documentation

## 2013-07-24 DIAGNOSIS — Z01810 Encounter for preprocedural cardiovascular examination: Secondary | ICD-10-CM | POA: Insufficient documentation

## 2013-07-24 LAB — CBC WITH DIFFERENTIAL/PLATELET
BASOS ABS: 0 10*3/uL (ref 0.0–0.1)
Basophils Relative: 0 % (ref 0–1)
Eosinophils Absolute: 0.2 10*3/uL (ref 0.0–0.7)
Eosinophils Relative: 3 % (ref 0–5)
HCT: 35.9 % — ABNORMAL LOW (ref 39.0–52.0)
Hemoglobin: 12.5 g/dL — ABNORMAL LOW (ref 13.0–17.0)
LYMPHS PCT: 30 % (ref 12–46)
Lymphs Abs: 2.2 10*3/uL (ref 0.7–4.0)
MCH: 33.7 pg (ref 26.0–34.0)
MCHC: 34.8 g/dL (ref 30.0–36.0)
MCV: 96.8 fL (ref 78.0–100.0)
Monocytes Absolute: 0.6 10*3/uL (ref 0.1–1.0)
Monocytes Relative: 9 % (ref 3–12)
Neutro Abs: 4.2 10*3/uL (ref 1.7–7.7)
Neutrophils Relative %: 58 % (ref 43–77)
PLATELETS: 291 10*3/uL (ref 150–400)
RBC: 3.71 MIL/uL — ABNORMAL LOW (ref 4.22–5.81)
RDW: 12.4 % (ref 11.5–15.5)
WBC: 7.3 10*3/uL (ref 4.0–10.5)

## 2013-07-24 LAB — BASIC METABOLIC PANEL WITH GFR
BUN: 7 mg/dL (ref 6–23)
CO2: 26 meq/L (ref 19–32)
Calcium: 9.2 mg/dL (ref 8.4–10.5)
Chloride: 94 meq/L — ABNORMAL LOW (ref 96–112)
Creatinine, Ser: 0.84 mg/dL (ref 0.50–1.35)
GFR calc Af Amer: >90 mL/min (ref >90)
GFR calc non Af Amer: 85 mL/min — ABNORMAL LOW (ref >90)
Glucose, Bld: 98 mg/dL (ref 70–99)
Potassium: 4.4 meq/L (ref 3.7–5.3)
Sodium: 133 meq/L — ABNORMAL LOW (ref 137–147)

## 2013-07-24 NOTE — Patient Instructions (Signed)
THIERRY DOBOSZ  07/24/2013   Your procedure is scheduled on:  07/29/2013  Report to [facility] at  1040  AM.  Call this number if you have problems the morning of surgery: [phone]   Remember:   Do not eat food or drink liquids after midnight.   Take these medicines the morning of surgery with A SIP OF WATER: atenolol, celexa, prilosec   Do not wear jewelry, make-up or nail polish.  Do not wear lotions, powders, or perfumes.   Do not shave 48 hours prior to surgery. Men may shave face and neck.  Do not bring valuables to the hospital.  [facility] is not responsible for any belongings or valuables.               Contacts, dentures or bridgework may not be worn into surgery.  Leave suitcase in the car. After surgery it may be brought to your room.  For patients admitted to the hospital, discharge time is determined by your  treatment team.               Patients discharged the day of surgery will not be allowed to drive  home.  Name and phone number of your driver: family  Special Instructions: Shower using CHG 2 nights before surgery and the night before surgery.  If you shower the day of surgery use CHG.  Use special wash - you have one bottle of CHG for all showers.  You should use approximately 1/3 of the bottle for each shower.   Please read over the following fact sheets that you were given: Pain Booklet, Coughing and Deep Breathing, Surgical Site Infection Prevention, Anesthesia Post-op Instructions and Care and Recovery After Surgery Basal Cell Carcinoma Basal cell carcinoma is the most common form of skin cancer. It begins in the basal cells, which are at the bottom of the outer skin layer (epidermis). CAUSES  Sun exposure is the most common cause of basal cell carcinoma. Basal cell carcinoma occurs most often on parts of the body that are frequently exposed to the sun, including the:  Scalp.  Ears.  Neck.  Face.  Arms.  Backs of the hands.  Legs. However,  basal cell carcinoma can occur anywhere on the body. Rarely, tumors develop on areas not exposed to the sun. Other causes of basal cell carcinoma can include:  Exposure to arsenic.  Exposure to radiation.  Certain genetic syndromes, such as xeroderma pigmentosum. RISK FACTORS People at highest risk for basal cell carcinoma include those with:  Fair skin.  Blonde or red hair.  Blue, green, or gray eyes.  Childhood freckling. Factors that increase your risk for basal cell carcinoma include:  Sun exposure over long periods of time. Childhood sun exposure appears to be a more significant factor than sun exposure as an adult.  Repeated sunburns.  Use of tanning beds.  Having a weakened immune system. SYMPTOMS Five signs of basal cell carcinoma are:  An open sore that bleeds, oozes, or crusts. The sore may remain open for 3 or more weeks. This can be an early sign of basal cell carcinoma. Basal cell carcinoma can mimic a pimple that will not heal.  A reddish or irritated area which may crust, itch, or cause discomfort. This may occur on areas expose d to the sun. These patches might be easier felt than seen.  A shiny, pearly, or translucent bump that is pink, red, or white. The bump may also  be tan, black, or brown, especially in dark haired people. These bumps can be confused with moles.  A pink growth with a slightly elevated, rolled border, and a crusted indentation in the center. As the growth slowly enlarges, tiny blood vessels may develop on the surface.  A scar-like white, yellow, or waxy area that looks like shiny, stretched skin. It often has irregular borders. This may be a sign of more aggressive basal cell carcinoma. DIAGNOSIS  Your caregiver may be able to tell what is wrong by doing a physical exam. Often, a tissue sample (biopsy) is also taken. The tissue is examined under a microscope.  TREATMENT  The treatment for basal cell carcinoma depends on the type, size,  location, and number of tumors. Possible treatments include:   Mohs surgery. This is a procedure done by a skin doctor (dermatologist or Mohs surgeon) in his or her office. The cancerous cells are removed layer by layer. This treatment has a high cure rate.  Surgical removal of the tumor.  Freezing the tumor with liquid nitrogen (cryosurgery).  Plastic surgery to remove the tumor, in the case of large tumors.  Radiation. This may be used for tumors on the face.  Photodynamic therapy. A chemical cream is applied to the skin and light exposure is used to activate the chemical.  Chemical treatments, such as imiquimod cream and interferon injections. This may be used to remove superficial tumors with minimal scarring.  Electrodesiccation and curettage. This involves alternately scraping and burning the tumor, using an electric current to control bleeding. Basal cell carcinoma can almost always be cured. It rarely spreads to other areas of the body (metastasizes). Basal cell carcinoma may come back at the same location (recur), but it can be treated again if this occurs. PREVENTION  Avoid the sun between 10:00 a.m. and 4:00 pm when it is the strongest.  Use a sunscreen or sunblock with a sun protection factor of 30 or greater.  Apply sunscreen at least 30 minutes before exposure to the sun.  Reapply sunscreen every 2 to 4 hours while you are outside, after swimming, and after excessive sweating.  Always wear protective hats, clothing, and sunglasses with ultraviolet protection.  Avoid tanning beds. HOME CARE INSTRUCTIONS   Avoid unprotected sun exposure.  Follow your caregiver's instructions for self-exams. Look for new spots or changes in your skin.  Keep all follow-up appointments as directed by your caregiver. SEEK MEDICAL CARE IF:   You notice any new spots or changes in your skin.  You have had a basal cell carcinoma tumor removed and you notice a new growth in the same  location. Document Released: 12/11/2002 Document Revised: 12/06/2011 Document Reviewed: 02/28/2011 [facility] Patient Information 2014 [place]. PATIENT INSTRUCTIONS POST-ANESTHESIA  IMMEDIATELY FOLLOWING SURGERY:  Do not drive or operate machinery for the first twenty four hours after surgery.  Do not make any important decisions for twenty four hours after surgery or while taking narcotic pain medications or sedatives.  If you develop intractable nausea and vomiting or a severe headache please notify your doctor immediately.  FOLLOW-UP:  Please make an appointment with your surgeon as instructed. You do not need to follow up with anesthesia unless specifically instructed to do so.  WOUND CARE INSTRUCTIONS (if applicable):  Keep a dry clean dressing on the anesthesia/puncture wound site if there is drainage.  Once the wound has quit draining you may leave it open to air.  Generally you should leave the bandage intact for twenty  four hours unless there is drainage.  If the epidural site drains for more than 36-48 hours please call the anesthesia department.  QUESTIONS?:  Please feel free to call your physician or the hospital operator if you have any questions, and they will be happy to assist you.

## 2013-07-25 ENCOUNTER — Other Ambulatory Visit: Payer: Self-pay | Admitting: Vascular Surgery

## 2013-07-25 DIAGNOSIS — I723 Aneurysm of iliac artery: Secondary | ICD-10-CM

## 2013-07-25 DIAGNOSIS — I714 Abdominal aortic aneurysm, without rupture, unspecified: Secondary | ICD-10-CM

## 2013-07-29 ENCOUNTER — Ambulatory Visit (HOSPITAL_COMMUNITY): Payer: Medicare Other | Admitting: Anesthesiology

## 2013-07-29 ENCOUNTER — Encounter (HOSPITAL_COMMUNITY): Payer: Self-pay | Admitting: *Deleted

## 2013-07-29 ENCOUNTER — Ambulatory Visit (HOSPITAL_COMMUNITY)
Admission: RE | Admit: 2013-07-29 | Discharge: 2013-07-29 | Disposition: A | Discharge status: Home / Self Care | Payer: Medicare Other | Source: Ambulatory Visit | Attending: General Surgery | Admitting: General Surgery

## 2013-07-29 ENCOUNTER — Encounter (HOSPITAL_COMMUNITY)
Admission: RE | Disposition: A | Discharge status: Home / Self Care | Payer: Self-pay | Source: Ambulatory Visit | Attending: General Surgery

## 2013-07-29 ENCOUNTER — Encounter (HOSPITAL_COMMUNITY): Payer: Medicare Other | Admitting: Anesthesiology

## 2013-07-29 DIAGNOSIS — E7800 Pure hypercholesterolemia, unspecified: Secondary | ICD-10-CM | POA: Insufficient documentation

## 2013-07-29 DIAGNOSIS — J449 Chronic obstructive pulmonary disease, unspecified: Secondary | ICD-10-CM | POA: Insufficient documentation

## 2013-07-29 DIAGNOSIS — J4489 Other specified chronic obstructive pulmonary disease: Secondary | ICD-10-CM | POA: Insufficient documentation

## 2013-07-29 DIAGNOSIS — C801 Malignant (primary) neoplasm, unspecified: Secondary | ICD-10-CM

## 2013-07-29 DIAGNOSIS — Z79899 Other long term (current) drug therapy: Secondary | ICD-10-CM | POA: Insufficient documentation

## 2013-07-29 DIAGNOSIS — Z7982 Long term (current) use of aspirin: Secondary | ICD-10-CM | POA: Insufficient documentation

## 2013-07-29 DIAGNOSIS — I10 Essential (primary) hypertension: Secondary | ICD-10-CM | POA: Insufficient documentation

## 2013-07-29 DIAGNOSIS — I2510 Atherosclerotic heart disease of native coronary artery without angina pectoris: Secondary | ICD-10-CM | POA: Insufficient documentation

## 2013-07-29 DIAGNOSIS — C44519 Basal cell carcinoma of skin of other part of trunk: Secondary | ICD-10-CM | POA: Insufficient documentation

## 2013-07-29 HISTORY — DX: Malignant (primary) neoplasm, unspecified: C80.1

## 2013-07-29 HISTORY — PX: LESION REMOVAL: SHX5196

## 2013-07-29 SURGERY — WIDE EXCISION, LESION, UPPER EXTREMITY
Anesthesia: General | Site: Back

## 2013-07-29 MED ORDER — FENTANYL CITRATE 0.05 MG/ML IJ SOLN
INTRAMUSCULAR | Status: AC
  Filled 2013-07-29: qty 5

## 2013-07-29 MED ORDER — BUPIVACAINE HCL (PF) 0.5 % IJ SOLN
INTRAMUSCULAR | Status: AC
  Filled 2013-07-29: qty 30

## 2013-07-29 MED ORDER — BACITRACIN ZINC 500 UNIT/GM EX OINT
TOPICAL_OINTMENT | CUTANEOUS | Status: AC
  Filled 2013-07-29: qty 0.9

## 2013-07-29 MED ORDER — POVIDONE-IODINE 10 % EX OINT
TOPICAL_OINTMENT | CUTANEOUS | Status: AC
  Filled 2013-07-29: qty 1

## 2013-07-29 MED ORDER — POVIDONE-IODINE 10 % OINT PACKET
TOPICAL_OINTMENT | CUTANEOUS | Status: DC | PRN
  Administered 2013-07-29: 1 via TOPICAL

## 2013-07-29 MED ORDER — MIDAZOLAM HCL 2 MG/2ML IJ SOLN
1.0000 mg | INTRAMUSCULAR | Status: DC | PRN
  Administered 2013-07-29: 2 mg via INTRAVENOUS

## 2013-07-29 MED ORDER — BUPIVACAINE HCL (PF) 0.5 % IJ SOLN
INTRAMUSCULAR | Status: DC | PRN
  Administered 2013-07-29: 9 mL

## 2013-07-29 MED ORDER — CHLORHEXIDINE GLUCONATE 4 % EX LIQD: 1.0000 "application " | Freq: Once | CUTANEOUS | Status: DC

## 2013-07-29 MED ORDER — FENTANYL CITRATE 0.05 MG/ML IJ SOLN
INTRAMUSCULAR | Status: AC
  Administered 2013-07-29: 25 ug
  Filled 2013-07-29: qty 2

## 2013-07-29 MED ORDER — GLYCOPYRROLATE 0.2 MG/ML IJ SOLN
0.2000 mg | Freq: Once | INTRAMUSCULAR | Status: AC
  Administered 2013-07-29: 0.2 mg via INTRAVENOUS

## 2013-07-29 MED ORDER — VANCOMYCIN HCL IN DEXTROSE 1-5 GM/200ML-% IV SOLN
1000.0000 mg | INTRAVENOUS | Status: AC
  Administered 2013-07-29: 1000 mg via INTRAVENOUS

## 2013-07-29 MED ORDER — KETOROLAC TROMETHAMINE 30 MG/ML IJ SOLN
INTRAMUSCULAR | Status: AC
  Filled 2013-07-29: qty 1

## 2013-07-29 MED ORDER — LACTATED RINGERS IV SOLN
INTRAVENOUS | Status: DC
  Administered 2013-07-29: 1000 mL via INTRAVENOUS

## 2013-07-29 MED ORDER — EPHEDRINE SULFATE 50 MG/ML IJ SOLN
INTRAMUSCULAR | Status: DC | PRN
  Administered 2013-07-29: 5 mg via INTRAVENOUS

## 2013-07-29 MED ORDER — MIDAZOLAM HCL 2 MG/2ML IJ SOLN
INTRAMUSCULAR | Status: AC
  Filled 2013-07-29: qty 2

## 2013-07-29 MED ORDER — ONDANSETRON HCL 4 MG/2ML IJ SOLN
INTRAMUSCULAR | Status: AC
  Filled 2013-07-29: qty 2

## 2013-07-29 MED ORDER — ONDANSETRON HCL 4 MG/2ML IJ SOLN: 4.0000 mg | Freq: Once | INTRAMUSCULAR | Status: DC | PRN

## 2013-07-29 MED ORDER — FENTANYL CITRATE 0.05 MG/ML IJ SOLN
INTRAMUSCULAR | Status: DC | PRN
  Administered 2013-07-29 (×2): 25 ug via INTRAVENOUS

## 2013-07-29 MED ORDER — ONDANSETRON HCL 4 MG/2ML IJ SOLN
4.0000 mg | Freq: Once | INTRAMUSCULAR | Status: AC
  Administered 2013-07-29: 4 mg via INTRAVENOUS

## 2013-07-29 MED ORDER — FENTANYL CITRATE 0.05 MG/ML IJ SOLN
25.0000 ug | INTRAMUSCULAR | Status: AC
  Administered 2013-07-29 (×2): 25 ug via INTRAVENOUS

## 2013-07-29 MED ORDER — SODIUM CHLORIDE BACTERIOSTATIC 0.9 % IJ SOLN
INTRAMUSCULAR | Status: AC
  Filled 2013-07-29: qty 10

## 2013-07-29 MED ORDER — FENTANYL CITRATE 0.05 MG/ML IJ SOLN: 25.0000 ug | INTRAMUSCULAR | Status: DC | PRN

## 2013-07-29 MED ORDER — LIDOCAINE HCL 1 % IJ SOLN
INTRAMUSCULAR | Status: DC | PRN
  Administered 2013-07-29: 30 mg via INTRADERMAL

## 2013-07-29 MED ORDER — KETOROLAC TROMETHAMINE 30 MG/ML IJ SOLN
30.0000 mg | Freq: Once | INTRAMUSCULAR | Status: AC
  Administered 2013-07-29: 30 mg via INTRAVENOUS

## 2013-07-29 MED ORDER — GLYCOPYRROLATE 0.2 MG/ML IJ SOLN
INTRAMUSCULAR | Status: AC
  Filled 2013-07-29: qty 1

## 2013-07-29 MED ORDER — EPHEDRINE SULFATE 50 MG/ML IJ SOLN
INTRAMUSCULAR | Status: AC
  Filled 2013-07-29: qty 1

## 2013-07-29 MED ORDER — PROPOFOL 10 MG/ML IV BOLUS
INTRAVENOUS | Status: DC | PRN
  Administered 2013-07-29: 150 mg via INTRAVENOUS

## 2013-07-29 MED ORDER — SODIUM CHLORIDE 0.9 % IR SOLN
Status: DC | PRN
  Administered 2013-07-29: 1000 mL

## 2013-07-29 MED ORDER — HYDROCODONE-ACETAMINOPHEN 5-325 MG PO TABS: 1.0000 | ORAL_TABLET | Freq: Four times a day (QID) | ORAL | Status: DC | PRN

## 2013-07-29 MED ORDER — VANCOMYCIN HCL IN DEXTROSE 1-5 GM/200ML-% IV SOLN
INTRAVENOUS | Status: AC
  Filled 2013-07-29: qty 200

## 2013-07-29 SURGICAL SUPPLY — 33 items
BAG HAMPER (MISCELLANEOUS) ×3 IMPLANT
CLOSURE WOUND 1/2 X4 (GAUZE/BANDAGES/DRESSINGS) ×1
CLOTH BEACON ORANGE TIMEOUT ST (SAFETY) ×3 IMPLANT
COVER LIGHT HANDLE STERIS (MISCELLANEOUS) ×6 IMPLANT
DURAPREP 26ML APPLICATOR (WOUND CARE) ×3 IMPLANT
ELECT REM PT RETURN 9FT ADLT (ELECTROSURGICAL) ×3
ELECTRODE REM PT RTRN 9FT ADLT (ELECTROSURGICAL) ×1 IMPLANT
FORMALIN 10 PREFIL 120ML (MISCELLANEOUS) ×3 IMPLANT
GAUZE XEROFORM 5X9 LF (GAUZE/BANDAGES/DRESSINGS) ×2 IMPLANT
GLOVE BIO SURGEON STRL SZ7.5 (GLOVE) ×3 IMPLANT
GLOVE BIOGEL PI IND STRL 7.0 (GLOVE) ×1 IMPLANT
GLOVE BIOGEL PI INDICATOR 7.0 (GLOVE) ×2
GLOVE SS BIOGEL STRL SZ 6.5 (GLOVE) IMPLANT
GLOVE SUPERSENSE BIOGEL SZ 6.5 (GLOVE) ×2
GOWN STRL REUS W/TWL LRG LVL3 (GOWN DISPOSABLE) ×6 IMPLANT
KIT ROOM TURNOVER APOR (KITS) ×3 IMPLANT
MANIFOLD NEPTUNE II (INSTRUMENTS) ×3 IMPLANT
NDL HYPO 25X1 1.5 SAFETY (NEEDLE) ×1 IMPLANT
NEEDLE HYPO 18GX1.5 BLUNT FILL (NEEDLE) IMPLANT
NEEDLE HYPO 25X1 1.5 SAFETY (NEEDLE) ×3 IMPLANT
NS IRRIG 1000ML POUR BTL (IV SOLUTION) ×3 IMPLANT
PACK MINOR (CUSTOM PROCEDURE TRAY) ×3 IMPLANT
PAD ARMBOARD 7.5X6 YLW CONV (MISCELLANEOUS) ×3 IMPLANT
SET BASIN LINEN APH (SET/KITS/TRAYS/PACK) ×3 IMPLANT
SOL PREP PROV IODINE SCRUB 4OZ (MISCELLANEOUS) IMPLANT
SPONGE GAUZE 4X4 12PLY (GAUZE/BANDAGES/DRESSINGS) ×2 IMPLANT
STRIP CLOSURE SKIN 1/2X4 (GAUZE/BANDAGES/DRESSINGS) ×2 IMPLANT
SUT PROLENE 3 0 PS 1 (SUTURE) ×9 IMPLANT
SUT SILK 2 0 SH (SUTURE) ×2 IMPLANT
SUT VIC AB 3-0 SH 27 (SUTURE) ×3
SUT VIC AB 3-0 SH 27X BRD (SUTURE) ×1 IMPLANT
SYR BULB IRRIGATION 50ML (SYRINGE) ×3 IMPLANT
SYR CONTROL 10ML LL (SYRINGE) ×3 IMPLANT

## 2013-07-29 NOTE — Anesthesia Postprocedure Evaluation (Signed)
  Anesthesia Post-op Note  Patient: Troy [name]  Procedure(s) Performed: Procedure(s): EXCISION OF MALIGNANT SKIN LESION BACK (N/A)  Patient Location: PACU  Anesthesia Type:General  Level of Consciousness: awake, alert  and oriented  Airway and Oxygen Therapy: Patient Spontanous Breathing and Patient connected to face mask oxygen  Post-op Pain: none  Post-op Assessment: Post-op Vital signs reviewed, Patient's Cardiovascular Status Stable, Respiratory Function Stable, Patent Airway and No signs of Nausea or vomiting  Post-op Vital Signs: Reviewed and stable  Complications: No apparent anesthesia complications

## 2013-07-29 NOTE — Op Note (Signed)
Patient:  Troy [name]  DOB:  Mar 20, 1941  MRN:  [id number]   Preop Diagnosis:  Basal cell carcinoma, back  Postop Diagnosis:  Same  Procedure:  Excision of malignant skin lesion, back  Surgeon:  Aviva Signs, M.D.  Anes:  General  Indications:  Patient is a 73 year old white male was undergone previous excision of a skin lesion on his back at his dermatologist's office. They're unable to get clear margins. He was referred for a formal excision and attempt to close the wound. The risks and benefits of the procedure including bleeding, infection, and wound breakdown were fully explained to the patient, who gave informed consent.  Procedure note:  The patient was placed in the left lateral decubitus position after general anesthesia was administered. The upper back was prepped and draped using usual sterile technique with DuraPrep. Surgical site confirmation was performed.  The lesion itself was approximately 1/2-2 cm since greatest diameter. It wasn't open wound. An elliptical incision was made around this transversely. A full thickness excision of the ulcerated basal cell carcinoma was performed down to the subcutaneous tissue. The ellipse was approximately 5 cm since greatest diameter. Grossly, I tried to get around the basal cell carcinoma with clear margins. A short suture was placed superiorly and a long suture placed laterally for orientation purposes. Sent to pathology further examination. A bleeding was controlled using Bovie electrocautery. The skin was reapproximated using 3-0 Prolene vertical mattress sutures. My attention was noted on the incision, my thought that this was be an adequate closure.  0.5% Sensorcaine was instilled the surrounding wound. Betadine ointment, Xeroform, and a dry sterile dressing were applied.  All tape and needle counts were correct at the end of the procedure. The patient was awakened and transferred to PACU in stable condition.    Complications:   None  EBL:  Minimal  Specimen:  Basal cell carcinoma, back, short suture superior, long suture lateral

## 2013-07-29 NOTE — Discharge Instructions (Signed)
Basal Cell Carcinoma  Basal cell carcinoma is the most common form of skin cancer. It begins in the basal cells, which are at the bottom of the outer skin layer (epidermis).  CAUSES   Sun exposure is the most common cause of basal cell carcinoma. Basal cell carcinoma occurs most often on parts of the body that are frequently exposed to the sun, including the:  · Scalp.  · Ears.  · Neck.  · Face.  · Arms.  · Backs of the hands.  · Legs.  However, basal cell carcinoma can occur anywhere on the body. Rarely, tumors develop on areas not exposed to the sun. Other causes of basal cell carcinoma can include:  · Exposure to arsenic.  · Exposure to radiation.  · Certain genetic syndromes, such as xeroderma pigmentosum.  RISK FACTORS  People at highest risk for basal cell carcinoma include those with:  · Fair skin.  · Blonde or red hair.  · Blue, green, or gray eyes.  · Childhood freckling.  Factors that increase your risk for basal cell carcinoma include:  · Sun exposure over long periods of time. Childhood sun exposure appears to be a more significant factor than sun exposure as an adult.  · Repeated sunburns.  · Use of tanning beds.  · Having a weakened immune system.  SYMPTOMS  Five signs of basal cell carcinoma are:  · An open sore that bleeds, oozes, or crusts. The sore may remain open for 3 or more weeks. This can be an early sign of basal cell carcinoma. Basal cell carcinoma can mimic a pimple that will not heal.  · A reddish or irritated area which may crust, itch, or cause discomfort. This may occur on areas expose d to the sun. These patches might be easier felt than seen.  · A shiny, pearly, or translucent bump that is pink, red, or white. The bump may also be tan, black, or brown, especially in dark haired people. These bumps can be confused with moles.  · A pink growth with a slightly elevated, rolled border, and a crusted indentation in the center. As the growth slowly enlarges, tiny blood vessels may develop  on the surface.  · A scar-like white, yellow, or waxy area that looks like shiny, stretched skin. It often has irregular borders. This may be a sign of more aggressive basal cell carcinoma.  DIAGNOSIS   Your caregiver may be able to tell what is wrong by doing a physical exam. Often, a tissue sample (biopsy) is also taken. The tissue is examined under a microscope.   TREATMENT   The treatment for basal cell carcinoma depends on the type, size, location, and number of tumors. Possible treatments include:   · Mohs surgery. This is a procedure done by a skin doctor (dermatologist or Mohs surgeon) in his or her office. The cancerous cells are removed layer by layer. This treatment has a high cure rate.  · Surgical removal of the tumor.  · Freezing the tumor with liquid nitrogen (cryosurgery).  · Plastic surgery to remove the tumor, in the case of large tumors.  · Radiation. This may be used for tumors on the face.  · Photodynamic therapy. A chemical cream is applied to the skin and light exposure is used to activate the chemical.  · Chemical treatments, such as imiquimod cream and interferon injections. This may be used to remove superficial tumors with minimal scarring.  · Electrodesiccation and curettage. This involves alternately scraping and   burning the tumor, using an electric current to control bleeding.  Basal cell carcinoma can almost always be cured. It rarely spreads to other areas of the body (metastasizes). Basal cell carcinoma may come back at the same location (recur), but it can be treated again if this occurs.  PREVENTION  · Avoid the sun between 10:00 a.m. and 4:00 pm when it is the strongest.  · Use a sunscreen or sunblock with a sun protection factor of 30 or greater.  · Apply sunscreen at least 30 minutes before exposure to the sun.  · Reapply sunscreen every 2 to 4 hours while you are outside, after swimming, and after excessive sweating.  · Always wear protective hats, clothing, and sunglasses with  ultraviolet protection.  · Avoid tanning beds.  HOME CARE INSTRUCTIONS   · Avoid unprotected sun exposure.  · Follow your caregiver's instructions for self-exams. Look for new spots or changes in your skin.  · Keep all follow-up appointments as directed by your caregiver.  SEEK MEDICAL CARE IF:   · You notice any new spots or changes in your skin.  · You have had a basal cell carcinoma tumor removed and you notice a new growth in the same location.  Document Released: 12/11/2002 Document Revised: 12/06/2011 Document Reviewed: 02/28/2011  ExitCare® Patient Information ©2014 [employer].

## 2013-07-29 NOTE — Anesthesia Procedure Notes (Signed)
Procedure Name: LMA Insertion Date/Time: 07/29/2013 11:03 AM Performed by: Tressie Stalker E Pre-anesthesia Checklist: Patient identified, Patient being monitored, Emergency Drugs available, Timeout performed and Suction available Patient Re-evaluated:Patient Re-evaluated prior to inductionOxygen Delivery Method: Circle System Utilized Preoxygenation: Pre-oxygenation with 100% oxygen Intubation Type: IV induction Ventilation: Mask ventilation without difficulty LMA: LMA inserted LMA Size: 4.0 Number of attempts: 1 Placement Confirmation: positive ETCO2 and breath sounds checked- equal and bilateral

## 2013-07-29 NOTE — Transfer of Care (Signed)
Immediate Anesthesia Transfer of Care Note  Patient: Troy [name]  Procedure(s) Performed: Procedure(s): EXCISION OF MALIGNANT SKIN LESION BACK (N/A)  Patient Location: PACU  Anesthesia Type:General  Level of Consciousness: awake, alert  and oriented  Airway & Oxygen Therapy: Patient Spontanous Breathing and Patient connected to face mask oxygen  Post-op Assessment: Report given to PACU RN  Post vital signs: Reviewed and stable  Complications: No apparent anesthesia complications

## 2013-07-29 NOTE — Interval H&P Note (Signed)
History and Physical Interval Note:  07/29/2013 10:49 AM  Troy [name]  has presented today for surgery, with the diagnosis of malignant skin lesion back  The various methods of treatment have been discussed with the patient and family. After consideration of risks, benefits and other options for treatment, the patient has consented to  Procedure(s): EXCISION SKIN LESION BACK (N/A) as a surgical intervention .  The patient's history has been reviewed, patient examined, no change in status, stable for surgery.  I have reviewed the patient's chart and labs.  Questions were answered to the patient's satisfaction.     Aviva Signs A

## 2013-07-29 NOTE — Anesthesia Preprocedure Evaluation (Signed)
Anesthesia Evaluation  Patient identified by MRN, date of birth, ID band Patient awake    Reviewed: Allergy & Precautions, H&P , NPO status , Patient's Chart, lab work & pertinent test results, reviewed documented beta blocker date and time   Airway Mallampati: I TM Distance: >3 FB     Dental  (+) Edentulous Upper and Edentulous Lower   Pulmonary COPDCurrent Smoker,  breath sounds clear to auscultation        Cardiovascular hypertension, Pt. on medications and Pt. on home beta blockers + angina + CAD, + CABG, + Peripheral Vascular Disease and +CHF + dysrhythmias Atrial Fibrillation Rhythm:Regular Rate:Normal     Neuro/Psych PSYCHIATRIC DISORDERS Depression    GI/Hepatic hiatal hernia, GERD-  Medicated and Controlled,  Endo/Other    Renal/GU      Musculoskeletal   Abdominal   Peds  Hematology   Anesthesia Other Findings   Reproductive/Obstetrics                           Anesthesia Physical Anesthesia Plan  ASA: III  Anesthesia Plan: General   Post-op Pain Management:    Induction: Intravenous  Airway Management Planned: LMA  Additional Equipment:   Intra-op Plan:   Post-operative Plan: Extubation in OR  Informed Consent: I have reviewed the patients History and Physical, chart, labs and discussed the procedure including the risks, benefits and alternatives for the proposed anesthesia with the patient or authorized representative who has indicated his/her understanding and acceptance.     Plan Discussed with:   Anesthesia Plan Comments:         Anesthesia Quick Evaluation

## 2013-07-30 ENCOUNTER — Encounter (HOSPITAL_COMMUNITY): Payer: Self-pay | Admitting: General Surgery

## 2013-08-22 ENCOUNTER — Encounter: Payer: Self-pay | Admitting: Cardiovascular Disease

## 2013-09-04 ENCOUNTER — Encounter: Payer: Self-pay | Admitting: Family

## 2013-09-05 ENCOUNTER — Encounter: Payer: Self-pay | Admitting: Family

## 2013-09-05 ENCOUNTER — Ambulatory Visit (INDEPENDENT_AMBULATORY_CARE_PROVIDER_SITE_OTHER): Payer: Medicare Other | Admitting: Family

## 2013-09-05 ENCOUNTER — Ambulatory Visit (HOSPITAL_COMMUNITY)
Admission: RE | Admit: 2013-09-05 | Discharge: 2013-09-05 | Disposition: A | Discharge status: Home / Self Care | Payer: Medicare Other | Source: Ambulatory Visit | Attending: Family | Admitting: Family

## 2013-09-05 VITALS — BP 111/79 | HR 65 | Resp 16 | Ht 71.0 in | Wt 171.0 lb

## 2013-09-05 DIAGNOSIS — I714 Abdominal aortic aneurysm, without rupture, unspecified: Secondary | ICD-10-CM | POA: Insufficient documentation

## 2013-09-05 DIAGNOSIS — I723 Aneurysm of iliac artery: Secondary | ICD-10-CM | POA: Insufficient documentation

## 2013-09-05 DIAGNOSIS — M79609 Pain in unspecified limb: Secondary | ICD-10-CM

## 2013-09-05 DIAGNOSIS — R0789 Other chest pain: Secondary | ICD-10-CM | POA: Insufficient documentation

## 2013-09-05 NOTE — Patient Instructions (Addendum)
Abdominal Aortic Aneurysm An aneurysm is a weakened or damaged part of an artery wall that bulges from the normal force of blood pumping through the body. An abdominal aortic aneurysm is an aneurysm that occurs in the lower part of the aorta, the main artery of the body.  The major concern with an abdominal aortic aneurysm is that it can enlarge and burst (rupture) or blood can flow between the layers of the wall of the aorta through a tear (aorticdissection). Both of these conditions can cause bleeding inside the body and can be life threatening unless diagnosed and treated promptly. CAUSES  The exact cause of an abdominal aortic aneurysm is unknown. Some contributing factors are:   A hardening of the arteries caused by the buildup of fat and other substances in the lining of a blood vessel (arteriosclerosis).  Inflammation of the walls of an artery (arteritis).   Connective tissue diseases, such as Marfan syndrome.   Abdominal trauma.   An infection, such as syphilis or staphylococcus, in the wall of the aorta (infectious aortitis) caused by bacteria. RISK FACTORS  Risk factors that contribute to an abdominal aortic aneurysm may include:  Age older than 60 years.   High blood pressure (hypertension).  Male gender.  Ethnicity (white race).  Obesity.  Family history of aneurysm (first degree relatives only).  Tobacco use. PREVENTION  The following healthy lifestyle habits may help decrease your risk of abdominal aortic aneurysm:  Quitting smoking. Smoking can raise your blood pressure and cause arteriosclerosis.  Limiting or avoiding alcohol.  Keeping your blood pressure, blood sugar level, and cholesterol levels within normal limits.  Decreasing your salt intake. In somepeople, too much salt can raise blood pressure and increase your risk of abdominal aortic aneurysm.  Eating a diet low in saturated fats and cholesterol.  Increasing your fiber intake by including  whole grains, vegetables, and fruits in your diet. Eating these foods may help lower blood pressure.  Maintaining a healthy weight.  Staying physically active and exercising regularly. SYMPTOMS  The symptoms of abdominal aortic aneurysm may vary depending on the size and rate of growth of the aneurysm.Most grow slowly and do not have any symptoms. When symptoms do occur, they may include:  Pain (abdomen, side, lower back, or groin). The pain may vary in intensity. A sudden onset of severe pain may indicate that the aneurysm has ruptured.  Feeling full after eating only small amounts of food.  Nausea or vomiting or both.  Feeling a pulsating lump in the abdomen.  Feeling faint or passing out. DIAGNOSIS  Since most unruptured abdominal aortic aneurysms have no symptoms, they are often discovered during diagnostic exams for other conditions. An aneurysm may be found during the following procedures:  Ultrasonography (A one-time screening for abdominal aortic aneurysm by ultrasonography is also recommended for all men aged 65-75 years who have ever smoked).  X-ray exams.  A computed tomography (CT).  Magnetic resonance imaging (MRI).  Angiography or arteriography. TREATMENT  Treatment of an abdominal aortic aneurysm depends on the size of your aneurysm, your age, and risk factors for rupture. Medication to control blood pressure and pain may be used to manage aneurysms smaller than 6 cm. Regular monitoring for enlargement may be recommended by your caregiver if:  The aneurysm is 3 4 cm in size (an annual ultrasonography may be recommended).  The aneurysm is 4 4.5 cm in size (an ultrasonography every 6 months may be recommended).  The aneurysm is larger than 4.5   cm in size (your caregiver may ask that you be examined by a vascular surgeon). If your aneurysm is larger than 6 cm, surgical repair may be recommended. There are two main methods for repair of an aneurysm:   Endovascular  repair (a minimally invasive surgery). This is done most often.  Open repair. This method is used if an endovascular repair is not possible. Document Released: 03/16/2005 Document Revised: 10/01/2012 Document Reviewed: 07/06/2012 Exit[employer] Patient Information 2014 [employer].   Smoking Cessation Quitting smoking is important to your health and has many advantages. However, it is not always easy to quit since nicotine is a very addictive drug. Often times, people try 3 times or more before being able to quit. This document explains the best ways for you to prepare to quit smoking. Quitting takes hard work and a lot of effort, but you can do it. ADVANTAGES OF QUITTING SMOKING  You will live longer, feel better, and live better.  Your body will feel the impact of quitting smoking almost immediately.  Within 20 minutes, blood pressure decreases. Your pulse returns to its normal level.  After 8 hours, carbon monoxide levels in the blood return to normal. Your oxygen level increases.  After 24 hours, the chance of having a heart attack starts to decrease. Your breath, hair, and body stop smelling like smoke.  After 48 hours, damaged nerve endings begin to recover. Your sense of taste and smell improve.  After 72 hours, the body is virtually free of nicotine. Your bronchial tubes relax and breathing becomes easier.  After 2 to 12 weeks, lungs can hold more air. Exercise becomes easier and circulation improves.  The risk of having a heart attack, stroke, cancer, or lung disease is greatly reduced.  After 1 year, the risk of coronary heart disease is cut in half.  After 5 years, the risk of stroke falls to the same as a nonsmoker.  After 10 years, the risk of lung cancer is cut in half and the risk of other cancers decreases significantly.  After 15 years, the risk of coronary heart disease drops, usually to the level of a nonsmoker.  If you are pregnant, quitting smoking will improve  your chances of having a healthy baby.  The people you live with, especially any children, will be healthier.  You will have extra money to spend on things other than cigarettes. QUESTIONS TO THINK ABOUT BEFORE ATTEMPTING TO QUIT You may want to talk about your answers with your caregiver.  Why do you want to quit?  If you tried to quit in the past, what helped and what did not?  What will be the most difficult situations for you after you quit? How will you plan to handle them?  Who can help you through the tough times? Your family? Friends? A caregiver?  What pleasures do you get from smoking? What ways can you still get pleasure if you quit? Here are some questions to ask your caregiver:  How can you help me to be successful at quitting?  What medicine do you think would be best for me and how should I take it?  What should I do if I need more help?  What is smoking withdrawal like? How can I get information on withdrawal? GET READY  Set a quit date.  Change your environment by getting rid of all cigarettes, ashtrays, matches, and lighters in your home, car, or work. Do not let people smoke in your home.  Review your   past attempts to quit. Think about what worked and what did not. GET SUPPORT AND ENCOURAGEMENT You have a better chance of being successful if you have help. You can get support in many ways.  Tell your family, friends, and co-workers that you are going to quit and need their support. Ask them not to smoke around you.  Get individual, group, or telephone counseling and support. Programs are available at local hospitals and health centers. Call your local health department for information about programs in your area.  Spiritual beliefs and practices may help some smokers quit.  Download a "quit meter" on your computer to keep track of quit statistics, such as how long you have gone without smoking, cigarettes not smoked, and money saved.  Get a self-help  book about quitting smoking and staying off of tobacco. LEARN NEW SKILLS AND BEHAVIORS  Distract yourself from urges to smoke. Talk to someone, go for a walk, or occupy your time with a task.  Change your normal routine. Take a different route to work. Drink tea instead of coffee. Eat breakfast in a different place.  Reduce your stress. Take a hot bath, exercise, or read a book.  Plan something enjoyable to do every day. Reward yourself for not smoking.  Explore interactive web-based programs that specialize in helping you quit. GET MEDICINE AND USE IT CORRECTLY Medicines can help you stop smoking and decrease the urge to smoke. Combining medicine with the above behavioral methods and support can greatly increase your chances of successfully quitting smoking.  Nicotine replacement therapy helps deliver nicotine to your body without the negative effects and risks of smoking. Nicotine replacement therapy includes nicotine gum, lozenges, inhalers, nasal sprays, and skin patches. Some may be available over-the-counter and others require a prescription.  Antidepressant medicine helps people abstain from smoking, but how this works is unknown. This medicine is available by prescription.  Nicotinic receptor partial agonist medicine simulates the effect of nicotine in your brain. This medicine is available by prescription. Ask your caregiver for advice about which medicines to use and how to use them based on your health history. Your caregiver will tell you what side effects to look out for if you choose to be on a medicine or therapy. Carefully read the information on the package. Do not use any other product containing nicotine while using a nicotine replacement product.  RELAPSE OR DIFFICULT SITUATIONS Most relapses occur within the first 3 months after quitting. Do not be discouraged if you start smoking again. Remember, most people try several times before finally quitting. You may have symptoms  of withdrawal because your body is used to nicotine. You may crave cigarettes, be irritable, feel very hungry, cough often, get headaches, or have difficulty concentrating. The withdrawal symptoms are only temporary. They are strongest when you first quit, but they will go away within 10 14 days. To reduce the chances of relapse, try to:  Avoid drinking alcohol. Drinking lowers your chances of successfully quitting.  Reduce the amount of caffeine you consume. Once you quit smoking, the amount of caffeine in your body increases and can give you symptoms, such as a rapid heartbeat, sweating, and anxiety.  Avoid smokers because they can make you want to smoke.  Do not let weight gain distract you. Many smokers will gain weight when they quit, usually less than 10 pounds. Eat a healthy diet and stay active. You can always lose the weight gained after you quit.  Find ways to improve your   mood other than smoking. FOR MORE INFORMATION  www.smokefree.gov  Document Released: 05/31/2001 Document Revised: 12/06/2011 Document Reviewed: 09/15/2011 [employer] Patient Information 2014 [employer].  

## 2013-09-05 NOTE — Progress Notes (Signed)
[facility]  Established Abdominal Aortic Aneurysm  History of Present Illness  Troy [name] is a 73 y.o. ([birth year]) male patient of Dr. Bridgett Larsson who presents with chief complaint: follow up for AAA.  Previous studies demonstrate an AAA, measuring 4.59 cm.  The patient does not have back or abdominal pain.  The patient is a smoker. He does not have any abdominal or back pain referable to AAA. He had TIA's years ago, none recently. He denies claudication symptoms in legs, denies non healing wounds.  He had a CABG X 5 vessels in 2012, denies hx of MI.  Pt Diabetic: No Pt smoker: smokes 8 cigarettes/day, started at age 41  Past Medical History  Diagnosis Date  . COPD (chronic obstructive pulmonary disease)   . Hypertension   . Hyperlipidemia   . Ulcer   . Reflux   . AAA (abdominal aortic aneurysm)   . CAD (coronary artery disease)     cath 11.30.2012 - 3vd  . Bilateral cataracts   . Retinal detachment     history of right detachment  . Angina   . Lung nodule     SPOT ON LUNG NOT DIAGNOSTED AS CANCER KEEPING AN EYE ON IT  . CHF (congestive heart failure)   . GERD (gastroesophageal reflux disease)   . Hiatal hernia   . Depression   . Status post coronary artery bypass grafting     surgery May 24, 2011 by Dr. Roxan Hockey.  [name] Postoperative atrial fibrillation     amiodarone therapy  . Back fracture    Past Surgical History  Procedure Laterality Date  . Lung surgery  1964  . Nose surgery    . Coronary angioplasty  1992    Stent  . Eye surgery      [name] DETACHED HAD SURGERY FOR IT  . Coronary artery bypass graft  05/24/2011    Procedure: CORONARY ARTERY BYPASS GRAFTING (CABG);  Surgeon: Melrose Nakayama, MD;  Location: [facility];  Service: Open Heart Surgery;  Laterality: N/A;  Coronary Artery Bypass Graft on pump times five, utlizing left internal mammary artery and right saphenous vein harvested endoscopically   . Lesion removal N/A  07/29/2013    Procedure: EXCISION OF MALIGNANT SKIN LESION BACK;  Surgeon: Jamesetta So, MD;  Location: [facility];  Service: General;  Laterality: N/A;   Social History History   Social History  . Marital Status: Married    Spouse Name: N/A    Number of Children: N/A  . Years of Education: N/A   Occupational History  . Not on file.   Social History Main Topics  . Smoking status: Current Some Day Smoker -- 1.00 packs/day for 56 years    Types: Cigarettes  . Smokeless tobacco: Never Used     Comment: Smoking a electronic cigarette and also states that he smokes regular cigs every now and then 2-4 per week  . Alcohol Use: 4.2 oz/week    7 Shots of liquor per week  . Drug Use: No  . Sexual Activity: Yes   Other Topics Concern  . Not on file   Social History Narrative  . No narrative on file   Family History Family History  Problem Relation Age of Onset  . Heart disease Father   . Heart disease Sister   . Heart disease Brother     Current Outpatient Prescriptions on File Prior to Visit  Medication Sig Dispense Refill  . aspirin 81 MG  tablet Take 81 mg by mouth daily.       [name] atenolol (TENORMIN) 50 MG tablet Take 50 mg by mouth daily.        [name] atorvastatin (LIPITOR) 40 MG tablet Take 1 tablet (40 mg total) by mouth daily.  90 tablet  3  . calcitonin, salmon, (MIACALCIN/FORTICAL) 200 UNIT/ACT nasal spray Place 1 spray into the nose daily. Rotates left & right daily      . calcium carbonate (OS-CAL) 600 MG TABS Take 600 mg by mouth 2 (two) times daily.      . citalopram (CELEXA) 20 MG tablet Take 20 mg by mouth daily.        . Cyanocobalamin (VITAMIN B 12 PO) Take 1 tablet by mouth every other day.      [name] HYDROcodone-acetaminophen (NORCO/VICODIN) 5-325 MG per tablet Take 1 tablet by mouth every 6 (six) hours as needed for moderate pain.  30 tablet  0  . nitroGLYCERIN (NITROSTAT) 0.4 MG SL tablet Place 0.4 mg under the tongue every 5 (five) minutes as needed for chest pain.        [name] omeprazole (PRILOSEC) 20 MG capsule Take 20 mg by mouth daily.      . SYMBICORT 160-4.5 MCG/ACT inhaler 1 puff 2 (two) times daily.        No current facility-administered medications on file prior to visit.   Allergies  Allergen Reactions  . Isosorbide Mononitrate     SEVER HEAD ACH ; PT COULD NOT SLEEP FOR 2 DAYS  . Penicillins Rash  . Ambien [Zolpidem Tartrate]     hallucination       ROS: See HPI for pertinent positives and negatives.  Physical Examination  Filed Vitals:   09/05/13 0934  BP: 111/79  Pulse: 65  Resp: 16   Filed Weights   09/05/13 0934  Weight: 171 lb (77.565 kg)   Body mass index is 23.86 kg/(m^2).  General: A&O x 3, WD.  Pulmonary: Sym exp, good air movt, CTAB, no rales, rhonchi, or wheezing.  Cardiac: RRR, Nl S1, S2, no detected murmur.   Carotid Bruits Left Right   Negative Negative   Aorta is not palpable Radial pulses are 1+ palpable and =                          VASCULAR EXAM:                                                                                                         LE Pulses LEFT RIGHT       FEMORAL   palpable   palpable        POPLITEAL  not palpable   not palpable       POSTERIOR TIBIAL  not palpable    palpable        DORSALIS PEDIS      ANTERIOR TIBIAL not palpable   not palpable      Gastrointestinal: soft, NTND, -G/R, - HSM, - masses, - CVAT B.  Musculoskeletal:  M/S 5/5 throughout , Extremities without ischemic changes.  Neurologic: CN 2-12 intact, Pain and light touch intact in extremities are intact except , Motor exam as listed above.  Non-Invasive Vascular Imaging  AAA Duplex (09/05/2013)  Previous size: 4.59 cm (Date: 09/05/2012)  Current size:  4.7 cm (Date: 09/05/2013)  Medical Decision Making  The patient is a 73 y.o. male who presents with asymptomatic AAA with no significant increase in size. He was counseled re smoking cessation.   Based on this patient's exam and diagnostic  studies, the patient will follow up in 6 months  with the following studies: AAA Duplex.  The threshold for repair is AAA size > 5.5 cm, growth > 1 cm/yr, and symptomatic status.  I emphasized the importance of maximal medical management including strict control of blood pressure, blood glucose, and lipid levels, antiplatelet agents, obtaining regular exercise, and  cessation of smoking.   The patient was given information about AAA including signs, symptoms, treatment, and how to minimize the risk of enlargement and rupture of aneurysms.    The patient was advised to call 911 should the patient experience sudden onset abdominal or back pain.   Thank you for allowing [place] to participate in this patient's care.  Clemon Chambers, RN, MSN, FNP-C [facility] Office: [phone]  Clinic Physician: Oneida Alar  09/05/2013, 9:17 AM

## 2013-09-17 ENCOUNTER — Ambulatory Visit (INDEPENDENT_AMBULATORY_CARE_PROVIDER_SITE_OTHER): Payer: Medicare Other | Admitting: Cardiovascular Disease

## 2013-09-17 ENCOUNTER — Encounter: Payer: Self-pay | Admitting: Cardiovascular Disease

## 2013-09-17 VITALS — BP 120/83 | HR 70 | Ht 71.0 in | Wt 171.0 lb

## 2013-09-17 DIAGNOSIS — F17200 Nicotine dependence, unspecified, uncomplicated: Secondary | ICD-10-CM

## 2013-09-17 DIAGNOSIS — I25810 Atherosclerosis of coronary artery bypass graft(s) without angina pectoris: Secondary | ICD-10-CM

## 2013-09-17 DIAGNOSIS — I714 Abdominal aortic aneurysm, without rupture, unspecified: Secondary | ICD-10-CM

## 2013-09-17 DIAGNOSIS — I452 Bifascicular block: Secondary | ICD-10-CM

## 2013-09-17 DIAGNOSIS — R079 Chest pain, unspecified: Secondary | ICD-10-CM

## 2013-09-17 DIAGNOSIS — E782 Mixed hyperlipidemia: Secondary | ICD-10-CM

## 2013-09-17 DIAGNOSIS — Z720 Tobacco use: Secondary | ICD-10-CM

## 2013-09-17 DIAGNOSIS — I2510 Atherosclerotic heart disease of native coronary artery without angina pectoris: Secondary | ICD-10-CM

## 2013-09-17 DIAGNOSIS — I10 Essential (primary) hypertension: Secondary | ICD-10-CM

## 2013-09-17 MED ORDER — ISOSORBIDE DINITRATE 10 MG PO TABS: 10.0000 mg | ORAL_TABLET | Freq: Three times a day (TID) | ORAL | Status: DC

## 2013-09-17 NOTE — Patient Instructions (Signed)
   Begin Isosorbide DN 10mg  three times per day - new sent to pharm Continue all other medications.   Follow up in  4-6 weeks

## 2013-09-17 NOTE — Progress Notes (Signed)
Patient ID: Troy [name], male   DOB: 1941/06/05, 73 y.o.   MRN: [id number]      SUBJECTIVE: The patient is a 73 year old male with a history of coronary artery disease and CABG in December 2012, hypertension, hyperlipidemia, and tobacco abuse, as well as an abdominal aortic aneurysm. ECG performed in the office today reveals normal sinus rhythm, heart rate 71 beats per minute, right bundle branch block, with a left anterior fascicular block. For the past 6 months, he has been experiencing chest discomfort radiating into his neck approximately one and sometimes 2 times per week. He has been afraid to take nitroglycerin at times because of one episode where his blood pressure bottomed out. He did take one sublingual nitroglycerin this past Sunday when it occurred, and he experienced relief from this. He had some mild associated shortness of breath and lightheadedness but this quickly resolved. He denies palpitations, leg swelling, and syncope. He smokes approximately 10 cigarettes per day but wants to quit. The physical activity he normally engages in involves yard work around the house.   Allergies  Allergen Reactions  . Isosorbide Mononitrate     SEVER HEAD ACH ; PT COULD NOT SLEEP FOR 2 DAYS  . Penicillins Rash  . Ambien [Zolpidem Tartrate]     hallucination       Current Outpatient Prescriptions  Medication Sig Dispense Refill  . aspirin 81 MG tablet Take 81 mg by mouth daily.       [name] atenolol (TENORMIN) 50 MG tablet Take 50 mg by mouth daily.        [name] atorvastatin (LIPITOR) 40 MG tablet Take 1 tablet (40 mg total) by mouth daily.  90 tablet  3  . calcitonin, salmon, (MIACALCIN/FORTICAL) 200 UNIT/ACT nasal spray Place 1 spray into the nose daily. Rotates left & right daily      . calcium carbonate (OS-CAL) 600 MG TABS Take 600 mg by mouth 2 (two) times daily.      . citalopram (CELEXA) 20 MG tablet Take 20 mg by mouth daily.        . Cyanocobalamin (VITAMIN B 12 PO) Take 1 tablet by  mouth every other day.      . nitroGLYCERIN (NITROSTAT) 0.4 MG SL tablet Place 0.4 mg under the tongue every 5 (five) minutes as needed for chest pain.       [name] omeprazole (PRILOSEC) 20 MG capsule Take 20 mg by mouth daily.      . SYMBICORT 160-4.5 MCG/ACT inhaler 1 puff 2 (two) times daily.        No current facility-administered medications for this visit.    Past Medical History  Diagnosis Date  . COPD (chronic obstructive pulmonary disease)   . Hypertension   . Hyperlipidemia   . Ulcer   . Reflux   . AAA (abdominal aortic aneurysm)   . CAD (coronary artery disease)     cath 11.30.2012 - 3vd  . Bilateral cataracts   . Retinal detachment     history of right detachment  . Angina   . Lung nodule     SPOT ON LUNG NOT DIAGNOSTED AS CANCER KEEPING AN EYE ON IT  . CHF (congestive heart failure)   . GERD (gastroesophageal reflux disease)   . Hiatal hernia   . Depression   . Status post coronary artery bypass grafting     surgery May 24, 2011 by Dr. Roxan Hockey.  [name] Postoperative atrial fibrillation     amiodarone therapy  . Back  fracture   . 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 Lung surgery  1964  . Nose surgery    . Coronary angioplasty  1992    Stent  . Eye surgery      [name] DETACHED HAD SURGERY FOR IT  . Coronary artery bypass graft  05/24/2011    Procedure: CORONARY ARTERY BYPASS GRAFTING (CABG);  Surgeon: Melrose Nakayama, MD;  Location: [facility];  Service: Open Heart Surgery;  Laterality: N/A;  Coronary Artery Bypass Graft on pump times five, utlizing left internal mammary artery and right saphenous vein harvested endoscopically   . Lesion removal N/A 07/29/2013    Procedure: EXCISION OF MALIGNANT SKIN LESION BACK;  Surgeon: Jamesetta So, MD;  Location: [facility];  Service: General;  Laterality: N/A;    History   Social History  . Marital Status: Married    Spouse Name: N/A    Number of Children:  N/A  . Years of Education: N/A   Occupational History  . Not on file.   Social History Main Topics  . Smoking status: Current Some Day Smoker -- 1.00 packs/day for 56 years    Types: Cigarettes  . Smokeless tobacco: Never Used     Comment: Smoking a electronic cigarette and also states that he smokes regular cigs every now and then 2-4 per week  . Alcohol Use: 4.2 oz/week    7 Shots of liquor per week  . Drug Use: No  . Sexual Activity: Yes   Other Topics Concern  . Not on file   Social History Narrative  . No narrative on file     Filed Vitals:   09/17/13 0818  BP: 120/83  Pulse: 70  Height: 5\' 11"  (1.803 m)  Weight: 171 lb (77.565 kg)  SpO2: 99%    PHYSICAL EXAM General: NAD Neck: No JVD, no thyromegaly. Lungs: Clear to auscultation bilaterally with normal respiratory effort. CV: Nondisplaced PMI.  Distant heart tones, regular rate and rhythm, normal S1/S2, no S3/S4, no murmur. No pretibial or periankle edema.  No carotid bruit.  Normal pedal pulses.  Abdomen: Soft, nontender, no hepatosplenomegaly, no distention.  Neurologic: Alert and oriented x 3.  Psych: Normal affect. Extremities: No clubbing or cyanosis.   ECG: reviewed and available in electronic records.      ASSESSMENT AND PLAN:  1. CAD/CABG: He has anginal symptoms. Echo in 11/2011 revealed normal LV systolic function, EF 49-70%. He is on ASA, atenolol, and atorvastatin. I will initiate isosorbide dinitrate 10 mg 3 times a day. 2. Hypertension: Blood pressure is at target on current medical therapy. No changes are indicated. 3. Hyperlipidemia: He said he had his lipids checked by his PCP. I will try and obtain these results. 4. AAA: This is followed by Dr. Bridgett Larsson in [place]. 5. Tobacco abuse: He currently smokes 10 cigarettes daily and is trying to quit. I gave him extensive counseling on tobacco cessation and on the deleterious effects of continued nicotine use.  Dispo: f/u 4-6 weeks.   Kate Sable, M.D., F.A.C.C.

## 2013-09-19 ENCOUNTER — Telehealth: Payer: Self-pay | Admitting: *Deleted

## 2013-09-19 NOTE — Telephone Encounter (Signed)
Discontinue isosorbide dinitrate. Start Ranexa 500 mg bid.

## 2013-09-19 NOTE — Telephone Encounter (Signed)
Just started Isosorbide DN three times per day on 09/17/2013.  States he can not tolerate this 3 x day due to causing headache and nausea since.

## 2013-09-20 MED ORDER — RANOLAZINE ER 500 MG PO TB12: 500.0000 mg | ORAL_TABLET | Freq: Two times a day (BID) | ORAL | Status: DC

## 2013-09-20 NOTE — Telephone Encounter (Signed)
Patient notified and verbalized understanding.  Will send new rx to [facility].   

## 2013-09-20 NOTE — Telephone Encounter (Signed)
Patient stopped the medication as he was told to do and wants to know what to do now.

## 2013-10-11 ENCOUNTER — Ambulatory Visit (INDEPENDENT_AMBULATORY_CARE_PROVIDER_SITE_OTHER): Payer: Medicare Other | Admitting: Cardiovascular Disease

## 2013-10-11 ENCOUNTER — Encounter: Payer: Self-pay | Admitting: *Deleted

## 2013-10-11 ENCOUNTER — Encounter: Payer: Self-pay | Admitting: Cardiovascular Disease

## 2013-10-11 VITALS — BP 117/81 | HR 63 | Ht 71.0 in | Wt 168.0 lb

## 2013-10-11 DIAGNOSIS — Z79899 Other long term (current) drug therapy: Secondary | ICD-10-CM

## 2013-10-11 DIAGNOSIS — I10 Essential (primary) hypertension: Secondary | ICD-10-CM

## 2013-10-11 DIAGNOSIS — I714 Abdominal aortic aneurysm, without rupture, unspecified: Secondary | ICD-10-CM

## 2013-10-11 DIAGNOSIS — E782 Mixed hyperlipidemia: Secondary | ICD-10-CM

## 2013-10-11 DIAGNOSIS — F17200 Nicotine dependence, unspecified, uncomplicated: Secondary | ICD-10-CM

## 2013-10-11 DIAGNOSIS — I25810 Atherosclerosis of coronary artery bypass graft(s) without angina pectoris: Secondary | ICD-10-CM

## 2013-10-11 DIAGNOSIS — I209 Angina pectoris, unspecified: Secondary | ICD-10-CM

## 2013-10-11 DIAGNOSIS — Z720 Tobacco use: Secondary | ICD-10-CM

## 2013-10-11 MED ORDER — RANOLAZINE ER 1000 MG PO TB12: 1000.0000 mg | ORAL_TABLET | Freq: Two times a day (BID) | ORAL | Status: DC

## 2013-10-11 NOTE — Patient Instructions (Signed)
Your physician has requested that you have a lexiscan myoview. For further information please visit [email or web address]. Please follow instruction sheet, as given. Office will contact with results via phone or letter.   Increase Ranexa to 1,000mg  twice a day  - new sent to pharm Continue all other medications.   Follow up in  2-3 weeks

## 2013-10-11 NOTE — Progress Notes (Signed)
Patient ID: Troy [name], male   DOB: April 27, 1941, 73 y.o.   MRN: [id number]      SUBJECTIVE: The patient is a 73 year old male with a history of coronary artery disease and CABG in December 2012, hypertension, hyperlipidemia, and tobacco abuse, as well as an abdominal aortic aneurysm.  He developed headaches and nausea with isosorbide dinitrate, and this was subsequently switched to Ranexa.  Since his last visit with me, he has had chest pain on 4 different occasions, and 3 consecutive nights. He tires out very easily with any significant exertion. This is a marked change from last year. He used to be able to push mow his small lawn, but when trying this within the past 2 weeks, he had to stop 10 times due to significant fatigue.   Allergies  Allergen Reactions  . Isosorbide Mononitrate     SEVER HEAD ACH ; PT COULD NOT SLEEP FOR 2 DAYS  . Penicillins Rash  . Ambien [Zolpidem Tartrate]     hallucination       Current Outpatient Prescriptions  Medication Sig Dispense Refill  . aspirin 81 MG tablet Take 81 mg by mouth daily.       [name] atenolol (TENORMIN) 50 MG tablet Take 50 mg by mouth daily.        [name] atorvastatin (LIPITOR) 40 MG tablet Take 1 tablet (40 mg total) by mouth daily.  90 tablet  3  . calcitonin, salmon, (MIACALCIN/FORTICAL) 200 UNIT/ACT nasal spray Place 1 spray into the nose daily. Rotates left & right daily      . calcium carbonate (OS-CAL) 600 MG TABS Take 600 mg by mouth 2 (two) times daily.      . citalopram (CELEXA) 20 MG tablet Take 20 mg by mouth daily.        . Cyanocobalamin (VITAMIN B 12 PO) Take 1 tablet by mouth every other day.      . nitroGLYCERIN (NITROSTAT) 0.4 MG SL tablet Place 0.4 mg under the tongue every 5 (five) minutes as needed for chest pain.       [name] omeprazole (PRILOSEC) 20 MG capsule Take 20 mg by mouth daily.      . ranolazine (RANEXA) 500 MG 12 hr tablet Take 1 tablet (500 mg total) by mouth 2 (two) times daily.  60 tablet  6  . SYMBICORT  160-4.5 MCG/ACT inhaler 1 puff 2 (two) times daily.        No current facility-administered medications for this visit.    Past Medical History  Diagnosis Date  . COPD (chronic obstructive pulmonary disease)   . Hypertension   . Hyperlipidemia   . Ulcer   . Reflux   . AAA (abdominal aortic aneurysm)   . CAD (coronary artery disease)     cath 11.30.2012 - 3vd  . Bilateral cataracts   . Retinal detachment     history of right detachment  . Angina   . Lung nodule     SPOT ON LUNG NOT DIAGNOSTED AS CANCER KEEPING AN EYE ON IT  . CHF (congestive heart failure)   . GERD (gastroesophageal reflux disease)   . Hiatal hernia   . Depression   . Status post coronary artery bypass grafting     surgery May 24, 2011 by Dr. Roxan Hockey.  [name] Postoperative atrial fibrillation     amiodarone therapy  . Back fracture   . 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 Lung surgery  1964  . Nose surgery    . Coronary angioplasty  1992    Stent  . Eye surgery      [name] DETACHED HAD SURGERY FOR IT  . Coronary artery bypass graft  05/24/2011    Procedure: CORONARY ARTERY BYPASS GRAFTING (CABG);  Surgeon: Melrose Nakayama, MD;  Location: [facility];  Service: Open Heart Surgery;  Laterality: N/A;  Coronary Artery Bypass Graft on pump times five, utlizing left internal mammary artery and right saphenous vein harvested endoscopically   . Lesion removal N/A 07/29/2013    Procedure: EXCISION OF MALIGNANT SKIN LESION BACK;  Surgeon: Jamesetta So, MD;  Location: [facility];  Service: General;  Laterality: N/A;    History   Social History  . Marital Status: Married    Spouse Name: N/A    Number of Children: N/A  . Years of Education: N/A   Occupational History  . Not on file.   Social History Main Topics  . Smoking status: Current Some Day Smoker -- 1.00 packs/day for 56 years    Types: Cigarettes  . Smokeless tobacco: Never Used      Comment: Smoking a electronic cigarette and also states that he smokes regular cigs every now and then 2-4 per week  . Alcohol Use: 4.2 oz/week    7 Shots of liquor per week  . Drug Use: No  . Sexual Activity: Yes   Other Topics Concern  . Not on file   Social History Narrative  . No narrative on file     Filed Vitals:   10/11/13 1134  BP: 117/81  Pulse: 63  Height: 5\' 11"  (1.803 m)  Weight: 168 lb (76.204 kg)    PHYSICAL EXAM General: NAD Neck: No JVD, no thyromegaly. Lungs: Clear to auscultation bilaterally with normal respiratory effort. CV: Nondisplaced PMI.  Regular rate and rhythm, normal S1/S2, no S3/S4, no murmur. No pretibial or periankle edema.  No carotid bruit.  Normal pedal pulses.  Abdomen: Soft, nontender, no hepatosplenomegaly, no distention.  Neurologic: Alert and oriented x 3.  Psych: Normal affect. Extremities: No clubbing or cyanosis.   ECG: reviewed and available in electronic records.      ASSESSMENT AND PLAN: 1. CAD/CABG: He has anginal symptoms and marked fatigue. Echo in 11/2011 revealed normal LV systolic function, EF 16-10%. He is on ASA, atenolol, and atorvastatin. He did not tolerate isosorbide dinitrate. I will increase Ranexa to 1000 mg bid, and obtain a Lexiscan Cardiolite stress test. I would have a low threshold for proceeding with coronary angiography. 2. Hypertension: Blood pressure is at target on current medical therapy. No changes are indicated.  3. Hyperlipidemia: He said he had his lipids checked by his PCP. I will try and obtain these results.  4. AAA: This is followed by Dr. Bridgett Larsson in [place].  5. Tobacco abuse: He currently smokes 10 cigarettes daily and is trying to quit. I gave him extensive counseling on tobacco cessation and on the deleterious effects of continued nicotine use.   Dispo: f/u 2-3 weeks.    Kate Sable, M.D., F.A.C.C.

## 2013-10-18 ENCOUNTER — Encounter (HOSPITAL_COMMUNITY): Payer: Self-pay

## 2013-10-18 ENCOUNTER — Encounter (HOSPITAL_COMMUNITY)
Admission: RE | Admit: 2013-10-18 | Discharge: 2013-10-18 | Disposition: A | Discharge status: Home / Self Care | Payer: Medicare Other | Source: Ambulatory Visit | Attending: Cardiovascular Disease | Admitting: Cardiovascular Disease

## 2013-10-18 ENCOUNTER — Ambulatory Visit (HOSPITAL_COMMUNITY)
Admission: RE | Admit: 2013-10-18 | Discharge: 2013-10-18 | Disposition: A | Discharge status: Home / Self Care | Payer: Medicare Other | Source: Ambulatory Visit | Attending: Cardiovascular Disease | Admitting: Cardiovascular Disease

## 2013-10-18 DIAGNOSIS — R9439 Abnormal result of other cardiovascular function study: Secondary | ICD-10-CM | POA: Insufficient documentation

## 2013-10-18 DIAGNOSIS — R079 Chest pain, unspecified: Secondary | ICD-10-CM | POA: Insufficient documentation

## 2013-10-18 DIAGNOSIS — R0789 Other chest pain: Secondary | ICD-10-CM

## 2013-10-18 DIAGNOSIS — I209 Angina pectoris, unspecified: Secondary | ICD-10-CM

## 2013-10-18 DIAGNOSIS — Z951 Presence of aortocoronary bypass graft: Secondary | ICD-10-CM | POA: Insufficient documentation

## 2013-10-18 DIAGNOSIS — I2510 Atherosclerotic heart disease of native coronary artery without angina pectoris: Secondary | ICD-10-CM | POA: Insufficient documentation

## 2013-10-18 DIAGNOSIS — I25810 Atherosclerosis of coronary artery bypass graft(s) without angina pectoris: Secondary | ICD-10-CM

## 2013-10-18 MED ORDER — SODIUM CHLORIDE 0.9 % IJ SOLN
INTRAMUSCULAR | Status: AC
  Administered 2013-10-18: 10 mL via INTRAVENOUS
  Filled 2013-10-18: qty 10

## 2013-10-18 MED ORDER — REGADENOSON 0.4 MG/5ML IV SOLN
INTRAVENOUS | Status: AC
  Administered 2013-10-18: 0.4 mg via INTRAVENOUS
  Filled 2013-10-18: qty 5

## 2013-10-18 MED ORDER — TECHNETIUM TC 99M SESTAMIBI - CARDIOLITE
10.0000 | Freq: Once | INTRAVENOUS | Status: AC | PRN
  Administered 2013-10-18: 07:00:00 10 via INTRAVENOUS

## 2013-10-18 MED ORDER — TECHNETIUM TC 99M SESTAMIBI GENERIC - CARDIOLITE
30.0000 | Freq: Once | INTRAVENOUS | Status: AC | PRN
  Administered 2013-10-18: 30 via INTRAVENOUS

## 2013-10-18 NOTE — Progress Notes (Signed)
Stress Lab Nurses Notes - Troy [name] 10/18/2013 Reason for doing test: CAD and Chest Pain Type of test: Wille Glaser Nurse performing test: Gerrit Halls, RN Nuclear Medicine Tech: Melburn Hake Echo Tech: Not Applicable Performing test: Koneswaran/K.Lawrence NP Family MD: [name] Test explained and consent signed: yes IV started: 22g jelco, Saline lock flushed, No redness or edema and Saline lock started in radiology Symptoms: SOB Treatment/Intervention: None Reason test stopped: protocol completed After recovery IV was: Discontinued via X-ray tech and No redness or edema Patient to return to [place]. Med at : 9:35 Patient discharged: Home Patient's Condition upon discharge was: stable Comments: During test BP 112/77 & HR 82.  Recovery BP 122/79 & HR 77.  Symptoms resolved in recovery. Donnajean Lopes

## 2013-10-25 ENCOUNTER — Telehealth: Payer: Self-pay | Admitting: Cardiovascular Disease

## 2013-10-25 NOTE — Telephone Encounter (Signed)
RESULTS FROM TEST PLEASE

## 2013-10-25 NOTE — Telephone Encounter (Signed)
Notes Recorded by Laurine Blazer, LPN on 0/01/222 at 3:61 PM Wife notified. Patient has follow up scheduled for 10/31/2013 with Dr. Bronson Ing. ------ Notes Recorded by Herminio Commons, MD on 10/18/2013 at 4:24 PM No change in Rx plan. No ischemia.

## 2013-10-31 ENCOUNTER — Encounter: Payer: Self-pay | Admitting: Cardiovascular Disease

## 2013-10-31 ENCOUNTER — Ambulatory Visit (INDEPENDENT_AMBULATORY_CARE_PROVIDER_SITE_OTHER): Payer: Medicare Other | Admitting: Cardiovascular Disease

## 2013-10-31 VITALS — BP 124/84 | HR 61 | Ht 71.0 in | Wt 169.0 lb

## 2013-10-31 DIAGNOSIS — R5383 Other fatigue: Secondary | ICD-10-CM

## 2013-10-31 DIAGNOSIS — R42 Dizziness and giddiness: Secondary | ICD-10-CM

## 2013-10-31 DIAGNOSIS — D649 Anemia, unspecified: Secondary | ICD-10-CM

## 2013-10-31 DIAGNOSIS — I10 Essential (primary) hypertension: Secondary | ICD-10-CM

## 2013-10-31 DIAGNOSIS — I209 Angina pectoris, unspecified: Secondary | ICD-10-CM

## 2013-10-31 DIAGNOSIS — R5381 Other malaise: Secondary | ICD-10-CM

## 2013-10-31 DIAGNOSIS — I25810 Atherosclerosis of coronary artery bypass graft(s) without angina pectoris: Secondary | ICD-10-CM

## 2013-10-31 DIAGNOSIS — Z720 Tobacco use: Secondary | ICD-10-CM

## 2013-10-31 DIAGNOSIS — E785 Hyperlipidemia, unspecified: Secondary | ICD-10-CM

## 2013-10-31 DIAGNOSIS — I2510 Atherosclerotic heart disease of native coronary artery without angina pectoris: Secondary | ICD-10-CM

## 2013-10-31 DIAGNOSIS — F17200 Nicotine dependence, unspecified, uncomplicated: Secondary | ICD-10-CM

## 2013-10-31 NOTE — Patient Instructions (Signed)
   Lab for CBC  Office will contact with results via phone or letter.   Referral to ENT - Dr. Redmond Pulling Continue all current medications. Follow up in  3-4 months

## 2013-10-31 NOTE — Progress Notes (Signed)
Patient ID: Troy [name], male   DOB: 09/13/1940, 73 y.o.   MRN: [id number]      SUBJECTIVE: The patient is here to followup on the results of cardiovascular testing performed for the evaluation of chest pain and fatigue. He is a 73 year old male with a history of coronary artery disease and CABG in December 2012, hypertension, hyperlipidemia, and tobacco abuse, as well as an abdominal aortic aneurysm.  He developed headaches and nausea with isosorbide dinitrate, and this was subsequently switched to Ranexa.   Lexiscan Cardiolite stress test demonstrated myocardial scar, with no evidence of ischemia.  His chest pain has been alleviated considerably with Ranexa 1000 mg twice daily. However, he continues to feel fatigued, and this is a noticeable change since last year. He has also been experiencing dizziness.  He had hemoglobin levels in the 8 mg/dL range in December 2014. Most recently it was 12.1 on 07/25/2013.   Allergies  Allergen Reactions  . Isosorbide Mononitrate     SEVER HEAD ACH ; PT COULD NOT SLEEP FOR 2 DAYS  . Penicillins Rash  . Ambien [Zolpidem Tartrate]     hallucination       Current Outpatient Prescriptions  Medication Sig Dispense Refill  . aspirin 81 MG tablet Take 81 mg by mouth daily.       [name] atenolol (TENORMIN) 50 MG tablet Take 50 mg by mouth daily.        [name] atorvastatin (LIPITOR) 40 MG tablet Take 1 tablet (40 mg total) by mouth daily.  90 tablet  3  . calcitonin, salmon, (MIACALCIN/FORTICAL) 200 UNIT/ACT nasal spray Place 1 spray into the nose daily. Rotates left & right daily      . calcium carbonate (OS-CAL) 600 MG TABS Take 600 mg by mouth 2 (two) times daily.      . citalopram (CELEXA) 20 MG tablet Take 20 mg by mouth daily.        . Cyanocobalamin (VITAMIN B 12 PO) Take 1 tablet by mouth every other day.      . nitroGLYCERIN (NITROSTAT) 0.4 MG SL tablet Place 0.4 mg under the tongue every 5 (five) minutes as needed for chest pain.       [name]  omeprazole (PRILOSEC) 20 MG capsule Take 20 mg by mouth daily.      . ranolazine (RANEXA) 1000 MG SR tablet Take 1 tablet (1,000 mg total) by mouth 2 (two) times daily.  60 tablet  6  . SYMBICORT 160-4.5 MCG/ACT inhaler 1 puff 2 (two) times daily.        No current facility-administered medications for this visit.    Past Medical History  Diagnosis Date  . COPD (chronic obstructive pulmonary disease)   . Hypertension   . Hyperlipidemia   . Ulcer   . Reflux   . AAA (abdominal aortic aneurysm)   . CAD (coronary artery disease)     cath 11.30.2012 - 3vd  . Bilateral cataracts   . Retinal detachment     history of right detachment  . Angina   . Lung nodule     SPOT ON LUNG NOT DIAGNOSTED AS CANCER KEEPING AN EYE ON IT  . CHF (congestive heart failure)   . GERD (gastroesophageal reflux disease)   . Hiatal hernia   . Depression   . Status post coronary artery bypass grafting     surgery May 24, 2011 by Dr. Roxan Hockey.  [name] Postoperative atrial fibrillation     amiodarone therapy  . Back fracture   .  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 Lung surgery  1964  . Nose surgery    . Coronary angioplasty  1992    Stent  . Eye surgery      [name] DETACHED HAD SURGERY FOR IT  . Coronary artery bypass graft  05/24/2011    Procedure: CORONARY ARTERY BYPASS GRAFTING (CABG);  Surgeon: Melrose Nakayama, MD;  Location: [place];  Service: Open Heart Surgery;  Laterality: N/A;  Coronary Artery Bypass Graft on pump times five, utlizing left internal mammary artery and right saphenous vein harvested endoscopically   . Lesion removal N/A 07/29/2013    Procedure: EXCISION OF MALIGNANT SKIN LESION BACK;  Surgeon: Jamesetta So, MD;  Location: [facility];  Service: General;  Laterality: N/A;    History   Social History  . Marital Status: Married    Spouse Name: N/A    Number of Children: N/A  . Years of Education: N/A    Occupational History  . Not on file.   Social History Main Topics  . Smoking status: Current Some Day Smoker -- 1.00 packs/day for 56 years    Types: Cigarettes  . Smokeless tobacco: Never Used     Comment: Smoking a electronic cigarette and also states that he smokes regular cigs every now and then 2-4 per week  . Alcohol Use: 4.2 oz/week    7 Shots of liquor per week  . Drug Use: No  . Sexual Activity: Yes   Other Topics Concern  . Not on file   Social History Narrative  . No narrative on file     Filed Vitals:   10/31/13 1115  BP: 124/84  Pulse: 61  Height: 5\' 11"  (1.803 m)  Weight: 169 lb (76.658 kg)  SpO2: 100%    PHYSICAL EXAM General: NAD Neck: No JVD, no thyromegaly. Lungs: Clear to auscultation bilaterally with normal respiratory effort. CV: Nondisplaced PMI.  Regular rate and rhythm, normal S1/S2, no S3/S4, no murmur. No pretibial or periankle edema.  No carotid bruit.  Normal pedal pulses.  Abdomen: Soft, nontender, no hepatosplenomegaly, no distention.  Neurologic: Alert and oriented x 3.  Psych: Normal affect. Extremities: No clubbing or cyanosis.   ECG: reviewed and available in electronic records.      ASSESSMENT AND PLAN: 1. CAD/CABG: Anginal pain has markedly improved with Ranexa at 1000 mg twice daily, which I will continue. Echo in 11/2011 revealed normal LV systolic function, EF 57-32%. He is on ASA, atenolol, and atorvastatin. He did not tolerate isosorbide dinitrate in the past. Lexiscan Cardiolite stress test demonstrated myocardial scar with no evidence of ischemia. I would have a low threshold for proceeding with coronary angiography if all other causes are ruled out. I will check a CBC to evaluate for anemia. 2. Hypertension: Blood pressure is at target on current medical therapy. No changes are indicated.  3. Hyperlipidemia: He said he had his lipids checked by his PCP. I will try and obtain these results.  4. AAA: This is followed by  Dr. Bridgett Larsson in [place].  5. Tobacco abuse: He currently smokes 10 cigarettes daily and is trying to quit. I previously gave him extensive counseling on tobacco cessation and on the deleterious effects of continued nicotine use.  6. Anemia: Will check a CBC. 7. Dizziness: He denies palpitations. I will make a referral to ENT as per his request.  Dispo: f/u 3-4 months.  Kate Sable, M.D., F.A.C.C.

## 2013-11-05 ENCOUNTER — Telehealth: Payer: Self-pay | Admitting: *Deleted

## 2013-11-05 NOTE — Telephone Encounter (Signed)
Message copied by Laurine Blazer on Tue Nov 05, 2013 10:38 AM ------      Message from: Kate Sable A      Created: Mon Nov 04, 2013  3:31 PM       Ok. ------

## 2013-11-05 NOTE — Telephone Encounter (Signed)
Notes Recorded by Laurine Blazer, LPN on 4/94/4967 at 59:16 AM Wife Hassan Rowan) notified. Will forward results to PMD.

## 2014-01-01 ENCOUNTER — Other Ambulatory Visit: Payer: Self-pay | Admitting: *Deleted

## 2014-01-01 MED ORDER — ATORVASTATIN CALCIUM 40 MG PO TABS: 40.0000 mg | ORAL_TABLET | Freq: Every day | ORAL | Status: DC

## 2014-01-14 ENCOUNTER — Telehealth: Payer: Self-pay

## 2014-01-14 NOTE — Telephone Encounter (Signed)
Phone call from pt.  Reported he has had pain in the right hip, right groin, and travels down the right leg.  Was instructed by his PCP to reschedule evaluation of AAA.  Stated he has had x-rays recently, and "they can't figure out what is causing my pain."  Stated he has not had a CT scan with recent x-rays.  Denies any redness, warmth, or swelling of the right groin.  Stated the pain occurs with activiy or with rest.  Denies any back pain; stated "the pain is mostly in the hip area, and not the back."   Requesting to move appt. up from September.  Advised will call pt. Tomorrow with change in appt.  Agrees w/ plan.

## 2014-01-15 NOTE — Telephone Encounter (Signed)
Spoke with patient to schedule appointment for Friday 01/31/14 @ 8:30am, dpm

## 2014-01-30 ENCOUNTER — Encounter: Payer: Self-pay | Admitting: Family

## 2014-01-31 ENCOUNTER — Ambulatory Visit (HOSPITAL_COMMUNITY)
Admission: RE | Admit: 2014-01-31 | Discharge: 2014-01-31 | Disposition: A | Discharge status: Home / Self Care | Payer: Medicare Other | Source: Ambulatory Visit | Attending: Family | Admitting: Family

## 2014-01-31 ENCOUNTER — Ambulatory Visit (INDEPENDENT_AMBULATORY_CARE_PROVIDER_SITE_OTHER): Payer: Medicare Other | Admitting: Podiatrist

## 2014-01-31 ENCOUNTER — Ambulatory Visit: Payer: Self-pay | Admitting: Podiatrist

## 2014-01-31 ENCOUNTER — Ambulatory Visit (INDEPENDENT_AMBULATORY_CARE_PROVIDER_SITE_OTHER): Payer: Medicare Other | Admitting: Family

## 2014-01-31 ENCOUNTER — Encounter: Payer: Self-pay | Admitting: Family

## 2014-01-31 ENCOUNTER — Encounter: Payer: Self-pay | Admitting: Podiatrist

## 2014-01-31 VITALS — BP 106/77 | HR 63 | Resp 16 | Ht 71.0 in | Wt 161.0 lb

## 2014-01-31 VITALS — BP 142/83 | HR 62 | Resp 17 | Ht 61.0 in | Wt 161.5 lb

## 2014-01-31 DIAGNOSIS — I714 Abdominal aortic aneurysm, without rupture, unspecified: Secondary | ICD-10-CM

## 2014-01-31 DIAGNOSIS — B351 Tinea unguium: Secondary | ICD-10-CM

## 2014-01-31 DIAGNOSIS — I2510 Atherosclerotic heart disease of native coronary artery without angina pectoris: Secondary | ICD-10-CM

## 2014-01-31 DIAGNOSIS — R0789 Other chest pain: Secondary | ICD-10-CM

## 2014-01-31 DIAGNOSIS — M79676 Pain in unspecified toe(s): Principal | ICD-10-CM

## 2014-01-31 DIAGNOSIS — M79609 Pain in unspecified limb: Secondary | ICD-10-CM | POA: Diagnosis present

## 2014-01-31 DIAGNOSIS — Z48812 Encounter for surgical aftercare following surgery on the circulatory system: Secondary | ICD-10-CM | POA: Insufficient documentation

## 2014-01-31 NOTE — Progress Notes (Signed)
[facility]  Established Abdominal Aortic Aneurysm  History of Present Illness  Troy [name] is a 73 y.o. (13-Jul-1940) male patient of Dr. Bridgett Larsson who presents with chief complaint: follow up for AAA. Previous studies demonstrate an AAA, measuring 4.59 cm. The patient does not have back or abdominal pain. The patient is a smoker.  He does not have any abdominal or back pain referable to AAA.  He had TIA's years ago, none recently.  He denies claudication symptoms in legs, denies non healing wounds.  He had a CABG X 5 vessels in 2012, denies hx of MI.  He has an appointment in 3 days with Dr. Case, ortho in [place], to evaluate right hip pain and pain in the bones of his right leg for about a month, worse with laying down a couple of weeks ago. The pain was constant, but now is intermittent. The pain seems to radiate from his right hip distally to his toes, at the anterior aspect of his right leg. His taking iron pills for anemia, recently started.  Pt Diabetic: No  Pt smoker: smokes 8 cigarettes/day, started at age 39   Past Medical History  Diagnosis Date  . COPD (chronic obstructive pulmonary disease)   . Hypertension   . Hyperlipidemia   . Ulcer   . Reflux   . AAA (abdominal aortic aneurysm)   . CAD (coronary artery disease)     cath 11.30.2012 - 3vd  . Bilateral cataracts   . Retinal detachment     history of right detachment  . Angina   . Lung nodule     SPOT ON LUNG NOT DIAGNOSTED AS CANCER KEEPING AN EYE ON IT  . CHF (congestive heart failure)   . GERD (gastroesophageal reflux disease)   . Hiatal hernia   . Depression   . Status post coronary artery bypass grafting     surgery May 24, 2011 by Dr. Roxan Hockey.  [name] Postoperative atrial fibrillation     amiodarone therapy  . Back fracture   . 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 Lung surgery  1964  . Nose surgery    .  Coronary angioplasty  1992    Stent  . Eye surgery      [name] DETACHED HAD SURGERY FOR IT  . Coronary artery bypass graft  05/24/2011    Procedure: CORONARY ARTERY BYPASS GRAFTING (CABG);  Surgeon: Melrose Nakayama, MD;  Location: [place];  Service: Open Heart Surgery;  Laterality: N/A;  Coronary Artery Bypass Graft on pump times five, utlizing left internal mammary artery and right saphenous vein harvested endoscopically   . Lesion removal N/A 07/29/2013    Procedure: EXCISION OF MALIGNANT SKIN LESION BACK;  Surgeon: Jamesetta So, MD;  Location: [facility];  Service: General;  Laterality: N/A;   Social History History   Social History  . Marital Status: Married    Spouse Name: N/A    Number of Children: N/A  . Years of Education: N/A   Occupational History  . Not on file.   Social History Main Topics  . Smoking status: Current Some Day Smoker -- 1.00 packs/day for 56 years    Types: Cigarettes  . Smokeless tobacco: Never Used     Comment: Smoking a electronic cigarette and also states that he smokes regular cigs every now and then 2-4 per week  . Alcohol Use: 4.2  oz/week    7 Shots of liquor per week  . Drug Use: No  . Sexual Activity: Yes   Other Topics Concern  . Not on file   Social History Narrative  . No narrative on file   Family History Family History  Problem Relation Age of Onset  . Heart disease Father   . Heart disease Sister   . Heart disease Brother     Current Outpatient Prescriptions on File Prior to Visit  Medication Sig Dispense Refill  . aspirin 81 MG tablet Take 81 mg by mouth daily.       [name] atenolol (TENORMIN) 50 MG tablet Take 50 mg by mouth daily.        [name] atorvastatin (LIPITOR) 40 MG tablet Take 1 tablet (40 mg total) by mouth daily.  90 tablet  0  . calcitonin, salmon, (MIACALCIN/FORTICAL) 200 UNIT/ACT nasal spray Place 1 spray into the nose daily. Rotates left & right daily      . calcium carbonate (OS-CAL) 600 MG TABS Take 600 mg by mouth 2  (two) times daily.      . citalopram (CELEXA) 20 MG tablet Take 20 mg by mouth daily.        . Cyanocobalamin (VITAMIN B 12 PO) Take 1 tablet by mouth every other day.      . nitroGLYCERIN (NITROSTAT) 0.4 MG SL tablet Place 0.4 mg under the tongue every 5 (five) minutes as needed for chest pain.       [name] omeprazole (PRILOSEC) 20 MG capsule Take 20 mg by mouth daily.      . ranolazine (RANEXA) 1000 MG SR tablet Take 1 tablet (1,000 mg total) by mouth 2 (two) times daily.  60 tablet  6  . SYMBICORT 160-4.5 MCG/ACT inhaler 1 puff 2 (two) times daily.        No current facility-administered medications on file prior to visit.   Allergies  Allergen Reactions  . Isosorbide Mononitrate     SEVER HEAD ACH ; PT COULD NOT SLEEP FOR 2 DAYS  . Penicillins Rash  . Ambien [Zolpidem Tartrate]     hallucination       ROS: See HPI for pertinent positives and negatives.  Physical Examination  Filed Vitals:   01/31/14 0906  BP: 106/77  Pulse: 63  Resp: 16  Height: 5\' 11"  (1.803 m)  Weight: 161 lb (73.029 kg)  SpO2: 100%   Body mass index is 22.46 kg/(m^2).  General: A&O x 3, WD.  Pulmonary: Sym exp, good air movt, CTAB, no rales, rhonchi, or wheezing.  Cardiac: RRR, Nl S1, S2, no detected murmur.   Carotid Bruits  Left  Right    Negative  Negative   Aorta is not palpable  Radial pulses are 1+ palpable and =  VASCULAR EXAM:  LE Pulses  LEFT  RIGHT   FEMORAL  3+palpable  3+palpable   POPLITEAL  not palpable  not palpable   POSTERIOR TIBIAL  2+ palpable  2+palpable   DORSALIS PEDIS  ANTERIOR TIBIAL  not palpable  not palpable    Gastrointestinal: soft, NTND, -G/R, - HSM, - masses, - CVAT B.  Musculoskeletal: M/S 5/5 throughout , Extremities without ischemic changes.  Neurologic: CN 2-12 intact, Pain and light touch intact in extremities are intact except , Motor exam as listed above.  Non-Invasive Vascular Imaging  AAA Duplex (01/31/2014)  Previous size: 4.7 cm (Date:  09/05/13)  Current size:  4.4 cm (Date: 01/31/2014)  Medical Decision Making  The patient is a 73 y.o. male who presents with asymptomatic AAA with no increase in size. He was again counseled re smoking cessation.  Based on this patient's exam and diagnostic studies, the patient will follow up in 6 months  with the following studies: AAA Duplex.  Consideration for repair of AAA would be made when the size is 5.5 cm, growth > 1 cm/yr, and symptomatic status.  I emphasized the importance of maximal medical management including strict control of blood pressure, blood glucose, and lipid levels, antiplatelet agents, obtaining regular exercise, and cessation of smoking.   The patient was given information about AAA including signs, symptoms, treatment, and how to minimize the risk of enlargement and rupture of aneurysms.    The patient was advised to call 911 should the patient experience sudden onset abdominal or back pain.   Thank you for allowing [place] to participate in this patient's care.  Clemon Chambers, RN, MSN, FNP-C [facility] Office: [phone]  Clinic Physician: Bridgett Larsson  01/31/2014, 9:12 AM

## 2014-01-31 NOTE — Progress Notes (Signed)
   Subjective:    Patient ID: Troy [name], male    DOB: 04-22-1941, 73 y.o.   MRN: [id number]  HPI Comments: Pt request debridement 10 of elongated, encurvated toenails.     Review of Systems  Musculoskeletal:       Current hip and back problem - scheduled to see orthopedic doctor on Monday.  All other systems reviewed and are negative.      Objective:   Physical Exam Patient is awake, alert, oriented x3. DP pulses are nonpalpable bilateral. PT pulses are palpable at 2/4 bilateral. Normal proximal to distal cooling is noted. Capillary refill time is less than 3 seconds. Neurological sensation is intact via Semmes Weinstein monofilament at 5/5 sites bilateral. Light touch and vibratory sensation also intact. Patient's toenails are elongated, thickened, discolored, dystrophic and clinically mycotic. Onychodystrophy and discomfort with palpation in debridement noted. Musculoskeletal examination reveals acceptable muscle, strength, tone and stability bilateral.      Assessment & Plan:  Symptomatic mycotic toenails  Plan: Debridement of toenails was carried out today without complication. Recommended routine debridement in 3 months or as needed.

## 2014-01-31 NOTE — Patient Instructions (Signed)
Abdominal Aortic Aneurysm An aneurysm is a weakened or damaged part of an artery wall that bulges from the normal force of blood pumping through the body. An abdominal aortic aneurysm is an aneurysm that occurs in the lower part of the aorta, the main artery of the body.  The major concern with an abdominal aortic aneurysm is that it can enlarge and burst (rupture) or blood can flow between the layers of the wall of the aorta through a tear (aorticdissection). Both of these conditions can cause bleeding inside the body and can be life threatening unless diagnosed and treated promptly. CAUSES  The exact cause of an abdominal aortic aneurysm is unknown. Some contributing factors are:   A hardening of the arteries caused by the buildup of fat and other substances in the lining of a blood vessel (arteriosclerosis).  Inflammation of the walls of an artery (arteritis).   Connective tissue diseases, such as Marfan syndrome.   Abdominal trauma.   An infection, such as syphilis or staphylococcus, in the wall of the aorta (infectious aortitis) caused by bacteria. RISK FACTORS  Risk factors that contribute to an abdominal aortic aneurysm may include:  Age older than 60 years.   High blood pressure (hypertension).  Male gender.  Ethnicity (white race).  Obesity.  Family history of aneurysm (first degree relatives only).  Tobacco use. PREVENTION  The following healthy lifestyle habits may help decrease your risk of abdominal aortic aneurysm:  Quitting smoking. Smoking can raise your blood pressure and cause arteriosclerosis.  Limiting or avoiding alcohol.  Keeping your blood pressure, blood sugar level, and cholesterol levels within normal limits.  Decreasing your salt intake. In somepeople, too much salt can raise blood pressure and increase your risk of abdominal aortic aneurysm.  Eating a diet low in saturated fats and cholesterol.  Increasing your fiber intake by including  whole grains, vegetables, and fruits in your diet. Eating these foods may help lower blood pressure.  Maintaining a healthy weight.  Staying physically active and exercising regularly. SYMPTOMS  The symptoms of abdominal aortic aneurysm may vary depending on the size and rate of growth of the aneurysm.Most grow slowly and do not have any symptoms. When symptoms do occur, they may include:  Pain (abdomen, side, lower back, or groin). The pain may vary in intensity. A sudden onset of severe pain may indicate that the aneurysm has ruptured.  Feeling full after eating only small amounts of food.  Nausea or vomiting or both.  Feeling a pulsating lump in the abdomen.  Feeling faint or passing out. DIAGNOSIS  Since most unruptured abdominal aortic aneurysms have no symptoms, they are often discovered during diagnostic exams for other conditions. An aneurysm may be found during the following procedures:  Ultrasonography (A one-time screening for abdominal aortic aneurysm by ultrasonography is also recommended for all men aged 65-75 years who have ever smoked).  X-ray exams.  A computed tomography (CT).  Magnetic resonance imaging (MRI).  Angiography or arteriography. TREATMENT  Treatment of an abdominal aortic aneurysm depends on the size of your aneurysm, your age, and risk factors for rupture. Medication to control blood pressure and pain may be used to manage aneurysms smaller than 6 cm. Regular monitoring for enlargement may be recommended by your caregiver if:  The aneurysm is 3-4 cm in size (an annual ultrasonography may be recommended).  The aneurysm is 4-4.5 cm in size (an ultrasonography every 6 months may be recommended).  The aneurysm is larger than 4.5 cm in   size (your caregiver may ask that you be examined by a vascular surgeon). If your aneurysm is larger than 6 cm, surgical repair may be recommended. There are two main methods for repair of an aneurysm:   Endovascular  repair (a minimally invasive surgery). This is done most often.  Open repair. This method is used if an endovascular repair is not possible. Document Released: 03/16/2005 Document Revised: 10/01/2012 Document Reviewed: 07/06/2012 Exit[employer] Patient Information 2015 [employer]. This information is not intended to replace advice given to you by your health care provider. Make sure you discuss any questions you have with your health care provider.   Smoking Cessation Quitting smoking is important to your health and has many advantages. However, it is not always easy to quit since nicotine is a very addictive drug. Oftentimes, people try 3 times or more before being able to quit. This document explains the best ways for you to prepare to quit smoking. Quitting takes hard work and a lot of effort, but you can do it. ADVANTAGES OF QUITTING SMOKING  You will live longer, feel better, and live better.  Your body will feel the impact of quitting smoking almost immediately.  Within 20 minutes, blood pressure decreases. Your pulse returns to its normal level.  After 8 hours, carbon monoxide levels in the blood return to normal. Your oxygen level increases.  After 24 hours, the chance of having a heart attack starts to decrease. Your breath, hair, and body stop smelling like smoke.  After 48 hours, damaged nerve endings begin to recover. Your sense of taste and smell improve.  After 72 hours, the body is virtually free of nicotine. Your bronchial tubes relax and breathing becomes easier.  After 2 to 12 weeks, lungs can hold more air. Exercise becomes easier and circulation improves.  The risk of having a heart attack, stroke, cancer, or lung disease is greatly reduced.  After 1 year, the risk of coronary heart disease is cut in half.  After 5 years, the risk of stroke falls to the same as a nonsmoker.  After 10 years, the risk of lung cancer is cut in half and the risk of other cancers  decreases significantly.  After 15 years, the risk of coronary heart disease drops, usually to the level of a nonsmoker.  If you are pregnant, quitting smoking will improve your chances of having a healthy baby.  The people you live with, especially any children, will be healthier.  You will have extra money to spend on things other than cigarettes. QUESTIONS TO THINK ABOUT BEFORE ATTEMPTING TO QUIT You may want to talk about your answers with your health care provider.  Why do you want to quit?  If you tried to quit in the past, what helped and what did not?  What will be the most difficult situations for you after you quit? How will you plan to handle them?  Who can help you through the tough times? Your family? Friends? A health care provider?  What pleasures do you get from smoking? What ways can you still get pleasure if you quit? Here are some questions to ask your health care provider:  How can you help me to be successful at quitting?  What medicine do you think would be best for me and how should I take it?  What should I do if I need more help?  What is smoking withdrawal like? How can I get information on withdrawal? GET READY  Set a quit   date.  Change your environment by getting rid of all cigarettes, ashtrays, matches, and lighters in your home, car, or work. Do not let people smoke in your home.  Review your past attempts to quit. Think about what worked and what did not. GET SUPPORT AND ENCOURAGEMENT You have a better chance of being successful if you have help. You can get support in many ways.  Tell your family, friends, and coworkers that you are going to quit and need their support. Ask them not to smoke around you.  Get individual, group, or telephone counseling and support. Programs are available at local hospitals and health centers. Call your local health department for information about programs in your area.  Spiritual beliefs and practices may  help some smokers quit.  Download a "quit meter" on your computer to keep track of quit statistics, such as how long you have gone without smoking, cigarettes not smoked, and money saved.  Get a self-help book about quitting smoking and staying off tobacco. LEARN NEW SKILLS AND BEHAVIORS  Distract yourself from urges to smoke. Talk to someone, go for a walk, or occupy your time with a task.  Change your normal routine. Take a different route to work. Drink tea instead of coffee. Eat breakfast in a different place.  Reduce your stress. Take a hot bath, exercise, or read a book.  Plan something enjoyable to do every day. Reward yourself for not smoking.  Explore interactive web-based programs that specialize in helping you quit. GET MEDICINE AND USE IT CORRECTLY Medicines can help you stop smoking and decrease the urge to smoke. Combining medicine with the above behavioral methods and support can greatly increase your chances of successfully quitting smoking.  Nicotine replacement therapy helps deliver nicotine to your body without the negative effects and risks of smoking. Nicotine replacement therapy includes nicotine gum, lozenges, inhalers, nasal sprays, and skin patches. Some may be available over-the-counter and others require a prescription.  Antidepressant medicine helps people abstain from smoking, but how this works is unknown. This medicine is available by prescription.  Nicotinic receptor partial agonist medicine simulates the effect of nicotine in your brain. This medicine is available by prescription. Ask your health care provider for advice about which medicines to use and how to use them based on your health history. Your health care provider will tell you what side effects to look out for if you choose to be on a medicine or therapy. Carefully read the information on the package. Do not use any other product containing nicotine while using a nicotine replacement product.    RELAPSE OR DIFFICULT SITUATIONS Most relapses occur within the first 3 months after quitting. Do not be discouraged if you start smoking again. Remember, most people try several times before finally quitting. You may have symptoms of withdrawal because your body is used to nicotine. You may crave cigarettes, be irritable, feel very hungry, cough often, get headaches, or have difficulty concentrating. The withdrawal symptoms are only temporary. They are strongest when you first quit, but they will go away within 10-14 days. To reduce the chances of relapse, try to:  Avoid drinking alcohol. Drinking lowers your chances of successfully quitting.  Reduce the amount of caffeine you consume. Once you quit smoking, the amount of caffeine in your body increases and can give you symptoms, such as a rapid heartbeat, sweating, and anxiety.  Avoid smokers because they can make you want to smoke.  Do not let weight gain distract you. Many   smokers will gain weight when they quit, usually less than 10 pounds. Eat a healthy diet and stay active. You can always lose the weight gained after you quit.  Find ways to improve your mood other than smoking. FOR MORE INFORMATION  www.smokefree.gov  Document Released: 05/31/2001 Document Revised: 10/21/2013 Document Reviewed: 09/15/2011 Exit[employer] Patient Information 2015 [employer]. This information is not intended to replace advice given to you by your health care provider. Make sure you discuss any questions you have with your health care provider.  

## 2014-01-31 NOTE — Patient Instructions (Signed)
GENERAL FOOT HEALTH INFORMATION:  Watch your toenails for any signs of infection including drainage, pus redness or swelling along the sides of the toenails.  Soak in epsom salt water and use antibiotic ointment (OTC) if you notice this start to occur.  If the redness does not resolve within 2-3 days, call for an appointment to be seen.

## 2014-02-03 ENCOUNTER — Ambulatory Visit (INDEPENDENT_AMBULATORY_CARE_PROVIDER_SITE_OTHER): Payer: Medicare Other | Admitting: Cardiovascular Disease

## 2014-02-03 ENCOUNTER — Encounter: Payer: Self-pay | Admitting: Cardiovascular Disease

## 2014-02-03 VITALS — BP 111/78 | HR 67 | Ht 71.0 in | Wt 163.0 lb

## 2014-02-03 DIAGNOSIS — I25810 Atherosclerosis of coronary artery bypass graft(s) without angina pectoris: Secondary | ICD-10-CM

## 2014-02-03 DIAGNOSIS — I714 Abdominal aortic aneurysm, without rupture, unspecified: Secondary | ICD-10-CM

## 2014-02-03 DIAGNOSIS — I209 Angina pectoris, unspecified: Secondary | ICD-10-CM

## 2014-02-03 DIAGNOSIS — F17200 Nicotine dependence, unspecified, uncomplicated: Secondary | ICD-10-CM

## 2014-02-03 DIAGNOSIS — D508 Other iron deficiency anemias: Secondary | ICD-10-CM

## 2014-02-03 DIAGNOSIS — E785 Hyperlipidemia, unspecified: Secondary | ICD-10-CM

## 2014-02-03 DIAGNOSIS — Z720 Tobacco use: Secondary | ICD-10-CM

## 2014-02-03 DIAGNOSIS — R5381 Other malaise: Secondary | ICD-10-CM

## 2014-02-03 DIAGNOSIS — R5383 Other fatigue: Secondary | ICD-10-CM

## 2014-02-03 DIAGNOSIS — Z79899 Other long term (current) drug therapy: Secondary | ICD-10-CM

## 2014-02-03 DIAGNOSIS — I10 Essential (primary) hypertension: Secondary | ICD-10-CM

## 2014-02-03 DIAGNOSIS — I2510 Atherosclerotic heart disease of native coronary artery without angina pectoris: Secondary | ICD-10-CM

## 2014-02-03 DIAGNOSIS — R42 Dizziness and giddiness: Secondary | ICD-10-CM

## 2014-02-03 DIAGNOSIS — I25709 Atherosclerosis of coronary artery bypass graft(s), unspecified, with unspecified angina pectoris: Secondary | ICD-10-CM

## 2014-02-03 MED ORDER — NITROGLYCERIN 0.4 MG SL SUBL: 0.4000 mg | SUBLINGUAL_TABLET | SUBLINGUAL | Status: AC | PRN

## 2014-02-03 MED ORDER — RANOLAZINE ER 500 MG PO TB12: 500.0000 mg | ORAL_TABLET | Freq: Two times a day (BID) | ORAL | Status: DC

## 2014-02-03 NOTE — Patient Instructions (Signed)
   Decrease Ranexa to 500mg  twice a day  - new sent to pharm  Nitroglycerin refill sent to pharm Continue all other medications.   Your physician wants you to follow up in: 6 months.  You will receive a reminder letter in the mail one-two months in advance.  If you don't receive a letter, please call our office to schedule the follow up appointment

## 2014-02-03 NOTE — Progress Notes (Signed)
Patient ID: Troy [name], male   DOB: Jul 04, 1940, 73 y.o.   MRN: [id number]      SUBJECTIVE: The patient is a 73 year old male with a history of coronary artery disease and CABG in December 2012, essential hypertension, hyperlipidemia, and tobacco abuse, as well as an abdominal aortic aneurysm most recently measuring at 4.7 x 4.7 cm in 08/2013. He also has a history of anemia, and Hgb was 11.7 in 10/2013. He previously developed headaches and nausea with isosorbide dinitrate, and this was subsequently switched to Ranexa.  Lexiscan Cardiolite stress test in 10/2013 demonstrated myocardial scar, with no evidence of ischemia.  He said his chest pain is less frequent, occurring about once a week. It usually occurs at rest. He continues to smoke 0.5 ppd, and says it is difficult to quit but wants to. He can walk anywhere from 100-200 yards before tiring out. Overall, he said he feels better than he had before.     Review of Systems: As per "subjective", otherwise negative.  Allergies  Allergen Reactions  . Isosorbide Mononitrate     SEVER HEAD ACH ; PT COULD NOT SLEEP FOR 2 DAYS  . Penicillins Rash  . Ambien [Zolpidem Tartrate]     hallucination       Current Outpatient Prescriptions  Medication Sig Dispense Refill  . aspirin 81 MG tablet Take 81 mg by mouth daily.       [name] atenolol (TENORMIN) 50 MG tablet Take 50 mg by mouth daily.        [name] atorvastatin (LIPITOR) 40 MG tablet Take 1 tablet (40 mg total) by mouth daily.  90 tablet  0  . calcitonin, salmon, (MIACALCIN/FORTICAL) 200 UNIT/ACT nasal spray Place 1 spray into the nose daily. Rotates left & right daily      . calcium carbonate (OS-CAL) 600 MG TABS Take 600 mg by mouth 2 (two) times daily.      . citalopram (CELEXA) 20 MG tablet Take 20 mg by mouth daily.        . Cyanocobalamin (VITAMIN B 12 PO) Take 1 tablet by mouth every other day.      . nitroGLYCERIN (NITROSTAT) 0.4 MG SL tablet Place 0.4 mg under the tongue every 5  (five) minutes as needed for chest pain.       [name] omeprazole (PRILOSEC) 20 MG capsule Take 20 mg by mouth daily.      . ranolazine (RANEXA) 1000 MG SR tablet Take 1 tablet (1,000 mg total) by mouth 2 (two) times daily.  60 tablet  6  . SYMBICORT 160-4.5 MCG/ACT inhaler 1 puff 2 (two) times daily.        No current facility-administered medications for this visit.    Past Medical History  Diagnosis Date  . COPD (chronic obstructive pulmonary disease)   . Hypertension   . Hyperlipidemia   . Ulcer   . Reflux   . AAA (abdominal aortic aneurysm)   . CAD (coronary artery disease)     cath 11.30.2012 - 3vd  . Bilateral cataracts   . Retinal detachment     history of right detachment  . Angina   . Lung nodule     SPOT ON LUNG NOT DIAGNOSTED AS CANCER KEEPING AN EYE ON IT  . CHF (congestive heart failure)   . GERD (gastroesophageal reflux disease)   . Hiatal hernia   . Depression   . Status post coronary artery bypass grafting     surgery May 24, 2011 by Dr.  Hendrickson.  [name] Postoperative atrial fibrillation     amiodarone therapy  . Back fracture   . 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 Lung surgery  1964  . Nose surgery    . Coronary angioplasty  1992    Stent  . Eye surgery      [name] DETACHED HAD SURGERY FOR IT  . Coronary artery bypass graft  05/24/2011    Procedure: CORONARY ARTERY BYPASS GRAFTING (CABG);  Surgeon: Melrose Nakayama, MD;  Location: [facility];  Service: Open Heart Surgery;  Laterality: N/A;  Coronary Artery Bypass Graft on pump times five, utlizing left internal mammary artery and right saphenous vein harvested endoscopically   . Lesion removal N/A 07/29/2013    Procedure: EXCISION OF MALIGNANT SKIN LESION BACK;  Surgeon: Jamesetta So, MD;  Location: [facility];  Service: General;  Laterality: N/A;    History   Social History  . Marital Status: Married    Spouse Name: N/A    Number of  Children: N/A  . Years of Education: N/A   Occupational History  . Not on file.   Social History Main Topics  . Smoking status: Current Some Day Smoker -- 1.00 packs/day for 56 years    Types: Cigarettes  . Smokeless tobacco: Never Used     Comment: Smoking a electronic cigarette and also states that he smokes regular cigs every now and then 2-4 per week  . Alcohol Use: 4.2 oz/week    7 Shots of liquor per week  . Drug Use: No  . Sexual Activity: Yes   Other Topics Concern  . Not on file   Social History Narrative  . No narrative on file     Filed Vitals:   02/03/14 0820  Weight: 163 lb (73.936 kg)   BP 111/78  Pulse 67   PHYSICAL EXAM General: NAD Neck: No JVD, no thyromegaly. Lungs: Clear to auscultation bilaterally with normal respiratory effort. CV: Nondisplaced PMI.  Regular rate and rhythm, normal S1/S2, no S3/S4, no murmur. No pretibial or periankle edema.  No carotid bruit.  Normal pedal pulses.  Abdomen: Soft, nontender, no hepatosplenomegaly, no distention.  Neurologic: Alert and oriented x 3.  Psych: Normal affect. Extremities: No clubbing or cyanosis.   ECG: reviewed and available in electronic records.      ASSESSMENT AND PLAN: 1. CAD/CABG: While anginal pain has markedly improved with Ranexa at 1000 mg twice daily, he prefers to reduce the dose for cost reasons. I will decrease to 500 mg bid, and refill his prescription for SL nitro. Echo in 11/2011 revealed normal LV systolic function, EF 23-30%. He is on ASA, atenolol, and atorvastatin. He did not tolerate isosorbide dinitrate in the past. Lexiscan Cardiolite stress test in 10/2013 demonstrated myocardial scar with no evidence of ischemia. I would have a low threshold for proceeding with coronary angiography if his symptoms were to increase in frequency/severity.  2. Essential hypertension: Blood pressure is at target on current medical therapy. No changes are indicated.   3. Hyperlipidemia: He said  he had his lipids checked by his PCP. I will try and obtain these results.   4. AAA: This is followed by Dr. Bridgett Larsson in [place]. Results noted above from 08/2013 (4.4 x 4.7 cm)  5. Tobacco abuse: He currently smokes 10 cigarettes daily and is trying to quit. I previously gave him extensive counseling on tobacco cessation and  on the deleterious effects of continued nicotine use.   6. Anemia: 11.7 in 10/2013.  7. Dizziness: He denies palpitations. I previously made a referral to ENT as per his request and he received therapy for this.   Dispo: f/u 6 months.    Kate Sable, M.D., F.A.C.C.

## 2014-02-05 ENCOUNTER — Other Ambulatory Visit: Payer: Self-pay | Admitting: *Deleted

## 2014-02-05 MED ORDER — ATORVASTATIN CALCIUM 40 MG PO TABS: 40.0000 mg | ORAL_TABLET | Freq: Every day | ORAL | Status: DC

## 2014-02-11 ENCOUNTER — Other Ambulatory Visit: Payer: Self-pay | Admitting: *Deleted

## 2014-02-11 MED ORDER — ATORVASTATIN CALCIUM 40 MG PO TABS: 40.0000 mg | ORAL_TABLET | Freq: Every day | ORAL | Status: DC

## 2014-03-13 ENCOUNTER — Other Ambulatory Visit (HOSPITAL_COMMUNITY): Payer: Medicare Other

## 2014-03-13 ENCOUNTER — Ambulatory Visit: Payer: Medicare Other | Admitting: Family

## 2014-06-10 ENCOUNTER — Other Ambulatory Visit: Payer: Self-pay | Admitting: *Deleted

## 2014-06-10 MED ORDER — RANOLAZINE ER 500 MG PO TB12: 500.0000 mg | ORAL_TABLET | Freq: Two times a day (BID) | ORAL | Status: AC

## 2014-07-28 ENCOUNTER — Ambulatory Visit: Payer: Medicare Other | Admitting: Cardiovascular Disease

## 2014-07-29 ENCOUNTER — Encounter: Payer: Self-pay | Admitting: Cardiovascular Disease

## 2014-07-29 ENCOUNTER — Encounter: Payer: Self-pay | Admitting: *Deleted

## 2014-07-29 ENCOUNTER — Ambulatory Visit (INDEPENDENT_AMBULATORY_CARE_PROVIDER_SITE_OTHER): Payer: Medicare Other | Admitting: Cardiovascular Disease

## 2014-07-29 VITALS — BP 120/80 | HR 75 | Ht 71.0 in | Wt 175.0 lb

## 2014-07-29 DIAGNOSIS — Z720 Tobacco use: Secondary | ICD-10-CM

## 2014-07-29 DIAGNOSIS — I714 Abdominal aortic aneurysm, without rupture, unspecified: Secondary | ICD-10-CM

## 2014-07-29 DIAGNOSIS — D508 Other iron deficiency anemias: Secondary | ICD-10-CM

## 2014-07-29 DIAGNOSIS — I25812 Atherosclerosis of bypass graft of coronary artery of transplanted heart without angina pectoris: Secondary | ICD-10-CM

## 2014-07-29 DIAGNOSIS — E785 Hyperlipidemia, unspecified: Secondary | ICD-10-CM

## 2014-07-29 NOTE — Progress Notes (Signed)
Patient ID: Troy [name], male   DOB: [birth year], 74 y.o.   MRN: [id number]      SUBJECTIVE: The patient presents for routine cardiovascular follow up. He has a history of coronary artery disease and CABG in December 2012, essential hypertension, hyperlipidemia, and tobacco abuse, as well as an abdominal aortic aneurysm most recently measuring at 4.2 x 4.4 cm in 01/2014. He also has a history of anemia, and Hgb was 11.7 in 10/2013. He previously developed headaches and nausea with isosorbide dinitrate, and this was subsequently switched to Ranexa.  Lexiscan Cardiolite stress test in 10/2013 demonstrated myocardial scar, with no evidence of ischemia. He denies chest pain, palpitations, leg swelling, and shortness of breath. He smokes 10 cigarettes at most a day, and sometimes not that many.   Review of Systems: As per "subjective", otherwise negative.  Allergies  Allergen Reactions  . Isosorbide Mononitrate     SEVER HEAD ACH ; PT COULD NOT SLEEP FOR 2 DAYS  . Penicillins Rash  . Ambien [Zolpidem Tartrate]     hallucination       Current Outpatient Prescriptions  Medication Sig Dispense Refill  . aspirin 81 MG tablet Take 81 mg by mouth daily.     [name] atenolol (TENORMIN) 50 MG tablet Take 50 mg by mouth daily.      [name] atorvastatin (LIPITOR) 40 MG tablet Take 1 tablet (40 mg total) by mouth daily. 90 tablet 3  . calcium carbonate (OS-CAL) 600 MG TABS Take 600 mg by mouth 2 (two) times daily.    . citalopram (CELEXA) 20 MG tablet Take 20 mg by mouth daily.      . Cyanocobalamin (VITAMIN B 12 PO) Take 1 tablet by mouth every other day.    . nitroGLYCERIN (NITROSTAT) 0.4 MG SL tablet Place 1 tablet (0.4 mg total) under the tongue every 5 (five) minutes as needed for chest pain. 25 tablet 3  . omeprazole (PRILOSEC) 20 MG capsule Take 20 mg by mouth daily.    . ranolazine (RANEXA) 500 MG 12 hr tablet Take 1 tablet (500 mg total) by mouth 2 (two) times daily. 180 tablet 3  . SYMBICORT 160-4.5  MCG/ACT inhaler 1 puff 2 (two) times daily.      No current facility-administered medications for this visit.    Past Medical History  Diagnosis Date  . COPD (chronic obstructive pulmonary disease)   . Hypertension   . Hyperlipidemia   . Ulcer   . Reflux   . AAA (abdominal aortic aneurysm)   . CAD (coronary artery disease)     cath 11.30.2012 - 3vd  . Bilateral cataracts   . Retinal detachment     history of right detachment  . Angina   . Lung nodule     SPOT ON LUNG NOT DIAGNOSTED AS CANCER KEEPING AN EYE ON IT  . CHF (congestive heart failure)   . GERD (gastroesophageal reflux disease)   . Hiatal hernia   . Depression   . Status post coronary artery bypass grafting     surgery May 24, 2011 by Dr. Roxan Hockey.  [name] Postoperative atrial fibrillation     amiodarone therapy  . Back fracture   . 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 Lung surgery  1964  . Nose surgery    . Coronary angioplasty  1992    Stent  . Eye surgery      [name]  DETACHED HAD SURGERY FOR IT  . Coronary artery bypass graft  05/24/2011    Procedure: CORONARY ARTERY BYPASS GRAFTING (CABG);  Surgeon: Melrose Nakayama, MD;  Location: [facility];  Service: Open Heart Surgery;  Laterality: N/A;  Coronary Artery Bypass Graft on pump times five, utlizing left internal mammary artery and right saphenous vein harvested endoscopically   . Lesion removal N/A 07/29/2013    Procedure: EXCISION OF MALIGNANT SKIN LESION BACK;  Surgeon: Jamesetta So, MD;  Location: [facility];  Service: General;  Laterality: N/A;    History   Social History  . Marital Status: Married    Spouse Name: N/A    Number of Children: N/A  . Years of Education: N/A   Occupational History  . Not on file.   Social History Main Topics  . Smoking status: Current Some Day Smoker -- 1.00 packs/day for 56 years    Types: Cigarettes    Start date: 02/15/1956  . Smokeless tobacco:  Never Used  . Alcohol Use: 4.2 oz/week    7 Shots of liquor per week  . Drug Use: No  . Sexual Activity: Yes   Other Topics Concern  . Not on file   Social History Narrative     Filed Vitals:   07/29/14 1133  BP: 120/80  Pulse: 75  Height: 5\' 11"  (1.803 m)  Weight: 175 lb (79.379 kg)    PHYSICAL EXAM General: NAD HEENT: Normal. Neck: No JVD, no thyromegaly. Lungs: Clear to auscultation bilaterally with normal respiratory effort. CV: Nondisplaced PMI.  Regular rate and rhythm, normal S1/S2, no S3/S4, no murmur. No pretibial or periankle edema.  No carotid bruit.   Abdomen: Soft, nontender, no hepatosplenomegaly, no distention.  Neurologic: Alert and oriented x 3.  Psych: Normal affect. Skin: Normal. Musculoskeletal: Normal range of motion, no gross deformities. Extremities: No clubbing or cyanosis.   ECG: Most recent ECG reviewed.      ASSESSMENT AND PLAN: 1. CAD/CABG: Stable ischemic heart disease. Echo in 11/2011 revealed normal LV systolic function, EF 49-17%. He is on ASA, atenolol, Ranexa, and atorvastatin. He did not tolerate isosorbide dinitrate in the past. Lexiscan Cardiolite stress test in 10/2013 demonstrated myocardial scar with no evidence of ischemia. I would have a low threshold for proceeding with coronary angiography if his symptoms were to increase in frequency/severity.  2. Essential hypertension: Blood pressure is at target on current medical therapy. No changes are indicated.   3. Hyperlipidemia: He said he had his lipids checked by his PCP. I will try and obtain these results.   4. AAA: This is followed by Dr. Bridgett Larsson in [place]. Results noted above from 01/2014 (4.2 x 4.4 cm)  5. Tobacco abuse: He currently smokes 10 cigarettes daily and is trying to quit. I previously gave him extensive counseling on tobacco cessation and on the deleterious effects of continued nicotine use.   6. Anemia: 11.7 in 10/2013.  Dispo: f/u 6 months.  Kate Sable, M.D., F.A.C.C.

## 2014-07-29 NOTE — Patient Instructions (Signed)
Continue all current medications. Your physician wants you to follow up in: 6 months.  You will receive a reminder letter in the mail one-two months in advance.  If you don't receive a letter, please call our office to schedule the follow up appointment   

## 2014-08-07 ENCOUNTER — Encounter: Payer: Self-pay | Admitting: Family

## 2014-08-08 ENCOUNTER — Ambulatory Visit (HOSPITAL_COMMUNITY)
Admission: RE | Admit: 2014-08-08 | Discharge: 2014-08-08 | Disposition: A | Discharge status: Home / Self Care | Payer: Medicare Other | Source: Ambulatory Visit | Attending: Family | Admitting: Family

## 2014-08-08 ENCOUNTER — Ambulatory Visit (INDEPENDENT_AMBULATORY_CARE_PROVIDER_SITE_OTHER): Payer: Medicare Other | Admitting: Family

## 2014-08-08 ENCOUNTER — Ambulatory Visit: Payer: Medicare Other | Admitting: Podiatrist

## 2014-08-08 ENCOUNTER — Encounter: Payer: Self-pay | Admitting: Family

## 2014-08-08 VITALS — BP 120/87 | HR 74 | Resp 16 | Ht 71.0 in | Wt 172.0 lb

## 2014-08-08 DIAGNOSIS — F17200 Nicotine dependence, unspecified, uncomplicated: Secondary | ICD-10-CM | POA: Insufficient documentation

## 2014-08-08 DIAGNOSIS — I714 Abdominal aortic aneurysm, without rupture, unspecified: Secondary | ICD-10-CM

## 2014-08-08 DIAGNOSIS — Z720 Tobacco use: Secondary | ICD-10-CM

## 2014-08-08 DIAGNOSIS — Z48812 Encounter for surgical aftercare following surgery on the circulatory system: Secondary | ICD-10-CM

## 2014-08-08 DIAGNOSIS — I25812 Atherosclerosis of bypass graft of coronary artery of transplanted heart without angina pectoris: Secondary | ICD-10-CM

## 2014-08-08 NOTE — Patient Instructions (Signed)
Abdominal Aortic Aneurysm An aneurysm is a weakened or damaged part of an artery wall that bulges from the normal force of blood pumping through the body. An abdominal aortic aneurysm is an aneurysm that occurs in the lower part of the aorta, the main artery of the body.  The major concern with an abdominal aortic aneurysm is that it can enlarge and burst (rupture) or blood can flow between the layers of the wall of the aorta through a tear (aorticdissection). Both of these conditions can cause bleeding inside the body and can be life threatening unless diagnosed and treated promptly. CAUSES  The exact cause of an abdominal aortic aneurysm is unknown. Some contributing factors are:   A hardening of the arteries caused by the buildup of fat and other substances in the lining of a blood vessel (arteriosclerosis).  Inflammation of the walls of an artery (arteritis).   Connective tissue diseases, such as Marfan syndrome.   Abdominal trauma.   An infection, such as syphilis or staphylococcus, in the wall of the aorta (infectious aortitis) caused by bacteria. RISK FACTORS  Risk factors that contribute to an abdominal aortic aneurysm may include:  Age older than 60 years.   High blood pressure (hypertension).  Male gender.  Ethnicity (white race).  Obesity.  Family history of aneurysm (first degree relatives only).  Tobacco use. PREVENTION  The following healthy lifestyle habits may help decrease your risk of abdominal aortic aneurysm:  Quitting smoking. Smoking can raise your blood pressure and cause arteriosclerosis.  Limiting or avoiding alcohol.  Keeping your blood pressure, blood sugar level, and cholesterol levels within normal limits.  Decreasing your salt intake. In somepeople, too much salt can raise blood pressure and increase your risk of abdominal aortic aneurysm.  Eating a diet low in saturated fats and cholesterol.  Increasing your fiber intake by including  whole grains, vegetables, and fruits in your diet. Eating these foods may help lower blood pressure.  Maintaining a healthy weight.  Staying physically active and exercising regularly. SYMPTOMS  The symptoms of abdominal aortic aneurysm may vary depending on the size and rate of growth of the aneurysm.Most grow slowly and do not have any symptoms. When symptoms do occur, they may include:  Pain (abdomen, side, lower back, or groin). The pain may vary in intensity. A sudden onset of severe pain may indicate that the aneurysm has ruptured.  Feeling full after eating only small amounts of food.  Nausea or vomiting or both.  Feeling a pulsating lump in the abdomen.  Feeling faint or passing out. DIAGNOSIS  Since most unruptured abdominal aortic aneurysms have no symptoms, they are often discovered during diagnostic exams for other conditions. An aneurysm may be found during the following procedures:  Ultrasonography (A one-time screening for abdominal aortic aneurysm by ultrasonography is also recommended for all men aged 65-75 years who have ever smoked).  X-ray exams.  A computed tomography (CT).  Magnetic resonance imaging (MRI).  Angiography or arteriography. TREATMENT  Treatment of an abdominal aortic aneurysm depends on the size of your aneurysm, your age, and risk factors for rupture. Medication to control blood pressure and pain may be used to manage aneurysms smaller than 6 cm. Regular monitoring for enlargement may be recommended by your caregiver if:  The aneurysm is 3-4 cm in size (an annual ultrasonography may be recommended).  The aneurysm is 4-4.5 cm in size (an ultrasonography every 6 months may be recommended).  The aneurysm is larger than 4.5 cm in   size (your caregiver may ask that you be examined by a vascular surgeon). If your aneurysm is larger than 6 cm, surgical repair may be recommended. There are two main methods for repair of an aneurysm:   Endovascular  repair (a minimally invasive surgery). This is done most often.  Open repair. This method is used if an endovascular repair is not possible. Document Released: 03/16/2005 Document Revised: 10/01/2012 Document Reviewed: 07/06/2012 ExitCare Patient Information 2015 [employer]. This information is not intended to replace advice given to you by your health care provider. Make sure you discuss any questions you have with your health care provider.   Smoking Cessation Quitting smoking is important to your health and has many advantages. However, it is not always easy to quit since nicotine is a very addictive drug. Oftentimes, people try 3 times or more before being able to quit. This document explains the best ways for you to prepare to quit smoking. Quitting takes hard work and a lot of effort, but you can do it. ADVANTAGES OF QUITTING SMOKING  You will live longer, feel better, and live better.  Your body will feel the impact of quitting smoking almost immediately.  Within 20 minutes, blood pressure decreases. Your pulse returns to its normal level.  After 8 hours, carbon monoxide levels in the blood return to normal. Your oxygen level increases.  After 24 hours, the chance of having a heart attack starts to decrease. Your breath, hair, and body stop smelling like smoke.  After 48 hours, damaged nerve endings begin to recover. Your sense of taste and smell improve.  After 72 hours, the body is virtually free of nicotine. Your bronchial tubes relax and breathing becomes easier.  After 2 to 12 weeks, lungs can hold more air. Exercise becomes easier and circulation improves.  The risk of having a heart attack, stroke, cancer, or lung disease is greatly reduced.  After 1 year, the risk of coronary heart disease is cut in half.  After 5 years, the risk of stroke falls to the same as a nonsmoker.  After 10 years, the risk of lung cancer is cut in half and the risk of other cancers  decreases significantly.  After 15 years, the risk of coronary heart disease drops, usually to the level of a nonsmoker.  If you are pregnant, quitting smoking will improve your chances of having a healthy baby.  The people you live with, especially any children, will be healthier.  You will have extra money to spend on things other than cigarettes. QUESTIONS TO THINK ABOUT BEFORE ATTEMPTING TO QUIT You may want to talk about your answers with your health care provider.  Why do you want to quit?  If you tried to quit in the past, what helped and what did not?  What will be the most difficult situations for you after you quit? How will you plan to handle them?  Who can help you through the tough times? Your family? Friends? A health care provider?  What pleasures do you get from smoking? What ways can you still get pleasure if you quit? Here are some questions to ask your health care provider:  How can you help me to be successful at quitting?  What medicine do you think would be best for me and how should I take it?  What should I do if I need more help?  What is smoking withdrawal like? How can I get information on withdrawal? GET READY  Set a quit   date.  Change your environment by getting rid of all cigarettes, ashtrays, matches, and lighters in your home, car, or work. Do not let people smoke in your home.  Review your past attempts to quit. Think about what worked and what did not. GET SUPPORT AND ENCOURAGEMENT You have a better chance of being successful if you have help. You can get support in many ways.  Tell your family, friends, and coworkers that you are going to quit and need their support. Ask them not to smoke around you.  Get individual, group, or telephone counseling and support. Programs are available at local hospitals and health centers. Call your local health department for information about programs in your area.  Spiritual beliefs and practices may  help some smokers quit.  Download a "quit meter" on your computer to keep track of quit statistics, such as how long you have gone without smoking, cigarettes not smoked, and money saved.  Get a self-help book about quitting smoking and staying off tobacco. LEARN NEW SKILLS AND BEHAVIORS  Distract yourself from urges to smoke. Talk to someone, go for a walk, or occupy your time with a task.  Change your normal routine. Take a different route to work. Drink tea instead of coffee. Eat breakfast in a different place.  Reduce your stress. Take a hot bath, exercise, or read a book.  Plan something enjoyable to do every day. Reward yourself for not smoking.  Explore interactive web-based programs that specialize in helping you quit. GET MEDICINE AND USE IT CORRECTLY Medicines can help you stop smoking and decrease the urge to smoke. Combining medicine with the above behavioral methods and support can greatly increase your chances of successfully quitting smoking.  Nicotine replacement therapy helps deliver nicotine to your body without the negative effects and risks of smoking. Nicotine replacement therapy includes nicotine gum, lozenges, inhalers, nasal sprays, and skin patches. Some may be available over-the-counter and others require a prescription.  Antidepressant medicine helps people abstain from smoking, but how this works is unknown. This medicine is available by prescription.  Nicotinic receptor partial agonist medicine simulates the effect of nicotine in your brain. This medicine is available by prescription. Ask your health care provider for advice about which medicines to use and how to use them based on your health history. Your health care provider will tell you what side effects to look out for if you choose to be on a medicine or therapy. Carefully read the information on the package. Do not use any other product containing nicotine while using a nicotine replacement product.    RELAPSE OR DIFFICULT SITUATIONS Most relapses occur within the first 3 months after quitting. Do not be discouraged if you start smoking again. Remember, most people try several times before finally quitting. You may have symptoms of withdrawal because your body is used to nicotine. You may crave cigarettes, be irritable, feel very hungry, cough often, get headaches, or have difficulty concentrating. The withdrawal symptoms are only temporary. They are strongest when you first quit, but they will go away within 10-14 days. To reduce the chances of relapse, try to:  Avoid drinking alcohol. Drinking lowers your chances of successfully quitting.  Reduce the amount of caffeine you consume. Once you quit smoking, the amount of caffeine in your body increases and can give you symptoms, such as a rapid heartbeat, sweating, and anxiety.  Avoid smokers because they can make you want to smoke.  Do not let weight gain distract you. Many   smokers will gain weight when they quit, usually less than 10 pounds. Eat a healthy diet and stay active. You can always lose the weight gained after you quit.  Find ways to improve your mood other than smoking. FOR MORE INFORMATION  www.smokefree.gov  Document Released: 05/31/2001 Document Revised: 10/21/2013 Document Reviewed: 09/15/2011 ExitCare Patient Information 2015 [employer]. This information is not intended to replace advice given to you by your health care provider. Make sure you discuss any questions you have with your health care provider.   Smoking Cessation, Tips for Success If you are ready to quit smoking, congratulations! You have chosen to help yourself be healthier. Cigarettes bring nicotine, tar, carbon monoxide, and other irritants into your body. Your lungs, heart, and blood vessels will be able to work better without these poisons. There are many different ways to quit smoking. Nicotine gum, nicotine patches, a nicotine inhaler, or nicotine  nasal spray can help with physical craving. Hypnosis, support groups, and medicines help break the habit of smoking. WHAT THINGS CAN I DO TO MAKE QUITTING EASIER?  Here are some tips to help you quit for good:  Pick a date when you will quit smoking completely. Tell all of your friends and family about your plan to quit on that date.  Do not try to slowly cut down on the number of cigarettes you are smoking. Pick a quit date and quit smoking completely starting on that day.  Throw away all cigarettes.   Clean and remove all ashtrays from your home, work, and car.  On a card, write down your reasons for quitting. Carry the card with you and read it when you get the urge to smoke.  Cleanse your body of nicotine. Drink enough water and fluids to keep your urine clear or pale yellow. Do this after quitting to flush the nicotine from your body.  Learn to predict your moods. Do not let a bad situation be your excuse to have a cigarette. Some situations in your life might tempt you into wanting a cigarette.  Never have "just one" cigarette. It leads to wanting another and another. Remind yourself of your decision to quit.  Change habits associated with smoking. If you smoked while driving or when feeling stressed, try other activities to replace smoking. Stand up when drinking your coffee. Brush your teeth after eating. Sit in a different chair when you read the paper. Avoid alcohol while trying to quit, and try to drink fewer caffeinated beverages. Alcohol and caffeine may urge you to smoke.  Avoid foods and drinks that can trigger a desire to smoke, such as sugary or spicy foods and alcohol.  Ask people who smoke not to smoke around you.  Have something planned to do right after eating or having a cup of coffee. For example, plan to take a walk or exercise.  Try a relaxation exercise to calm you down and decrease your stress. Remember, you may be tense and nervous for the first 2 weeks after  you quit, but this will pass.  Find new activities to keep your hands busy. Play with a pen, coin, or rubber band. Doodle or draw things on paper.  Brush your teeth right after eating. This will help cut down on the craving for the taste of tobacco after meals. You can also try mouthwash.   Use oral substitutes in place of cigarettes. Try using lemon drops, carrots, cinnamon sticks, or chewing gum. Keep them handy so they are available when you   have the urge to smoke.  When you have the urge to smoke, try deep breathing.  Designate your home as a nonsmoking area.  If you are a heavy smoker, ask your health care provider about a prescription for nicotine chewing gum. It can ease your withdrawal from nicotine.  Reward yourself. Set aside the cigarette money you save and buy yourself something nice.  Look for support from others. Join a support group or smoking cessation program. Ask someone at home or at work to help you with your plan to quit smoking.  Always ask yourself, "Do I need this cigarette or is this just a reflex?" Tell yourself, "Today, I choose not to smoke," or "I do not want to smoke." You are reminding yourself of your decision to quit.  Do not replace cigarette smoking with electronic cigarettes (commonly called e-cigarettes). The safety of e-cigarettes is unknown, and some may contain harmful chemicals.  If you relapse, do not give up! Plan ahead and think about what you will do the next time you get the urge to smoke. HOW WILL I FEEL WHEN I QUIT SMOKING? You may have symptoms of withdrawal because your body is used to nicotine (the addictive substance in cigarettes). You may crave cigarettes, be irritable, feel very hungry, cough often, get headaches, or have difficulty concentrating. The withdrawal symptoms are only temporary. They are strongest when you first quit but will go away within 10-14 days. When withdrawal symptoms occur, stay in control. Think about your reasons  for quitting. Remind yourself that these are signs that your body is healing and getting used to being without cigarettes. Remember that withdrawal symptoms are easier to treat than the major diseases that smoking can cause.  Even after the withdrawal is over, expect periodic urges to smoke. However, these cravings are generally short lived and will go away whether you smoke or not. Do not smoke! WHAT RESOURCES ARE AVAILABLE TO HELP ME QUIT SMOKING? Your health care provider can direct you to community resources or hospitals for support, which may include:  Group support.  Education.  Hypnosis.  Therapy. Document Released: 03/04/2004 Document Revised: 10/21/2013 Document Reviewed: 11/22/2012 ExitCare Patient Information 2015 [employer]. This information is not intended to replace advice given to you by your health care provider. Make sure you discuss any questions you have with your health care provider.   

## 2014-08-08 NOTE — Progress Notes (Signed)
[facility] [place]  Established Abdominal Aortic Aneurysm  History of Present Illness  Troy [name] is a 74 y.o. (1940/11/11) male patient of Dr. Bridgett Larsson who presents with chief complaint: follow up for AAA. Previous studies demonstrate an AAA, measuring 4.59 cm. The patient does not have back or abdominal pain. The patient is a smoker.  He does not have any abdominal or back pain referable to AAA.  He had TIA's years ago, none recently.  He denies claudication symptoms in legs, denies non healing wounds.  He had a CABG X 5 vessels in 2012, denies hx of MI.  His right hip pain improved spontaneously.  His taking iron pills for anemia.  Pt Diabetic: No  Pt smoker: smokes 5 cigarettes/day, decreased from 8 at previous visit, was up to 3 ppd, started at age 67  Past Medical History  Diagnosis Date  . COPD (chronic obstructive pulmonary disease)   . Hypertension   . Hyperlipidemia   . Ulcer   . Reflux   . AAA (abdominal aortic aneurysm)   . CAD (coronary artery disease)     cath 11.30.2012 - 3vd  . Bilateral cataracts   . Retinal detachment     history of right detachment  . Angina   . Lung nodule     SPOT ON LUNG NOT DIAGNOSTED AS CANCER KEEPING AN EYE ON IT  . CHF (congestive heart failure)   . GERD (gastroesophageal reflux disease)   . Hiatal hernia   . Depression   . Status post coronary artery bypass grafting     surgery May 24, 2011 by Dr. Roxan Hockey.  [name] Postoperative atrial fibrillation     amiodarone therapy  . Back fracture   . 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 Lung surgery  1964  . Nose surgery    . Coronary angioplasty  1992    Stent  . Eye surgery      [name] DETACHED HAD SURGERY FOR IT  . Coronary artery bypass graft  05/24/2011    Procedure: CORONARY ARTERY BYPASS GRAFTING (CABG);  Surgeon: Melrose Nakayama, MD;  Location: [facility];  Service: Open Heart  Surgery;  Laterality: N/A;  Coronary Artery Bypass Graft on pump times five, utlizing left internal mammary artery and right saphenous vein harvested endoscopically   . Lesion removal N/A 07/29/2013    Procedure: EXCISION OF MALIGNANT SKIN LESION BACK;  Surgeon: Jamesetta So, MD;  Location: [facility];  Service: General;  Laterality: N/A;   Social History History   Social History  . Marital Status: Married    Spouse Name: N/A  . Number of Children: N/A  . Years of Education: N/A   Occupational History  . Not on file.   Social History Main Topics  . Smoking status: Current Some Day Smoker -- 1.00 packs/day for 56 years    Types: Cigarettes    Start date: 02/15/1956  . Smokeless tobacco: Never Used  . Alcohol Use: 4.2 oz/week    7 Shots of liquor per week  . Drug Use: No  . Sexual Activity: Yes   Other Topics Concern  . Not on file   Social History Narrative   Family History Family History  Problem Relation Age of Onset  . Heart disease Father   . Heart disease Sister   . Heart disease Brother     Current Outpatient Prescriptions on File  Prior to Visit  Medication Sig Dispense Refill  . aspirin 81 MG tablet Take 81 mg by mouth daily.     [name] atenolol (TENORMIN) 50 MG tablet Take 50 mg by mouth daily.      [name] atorvastatin (LIPITOR) 40 MG tablet Take 1 tablet (40 mg total) by mouth daily. 90 tablet 3  . calcium carbonate (OS-CAL) 600 MG TABS Take 600 mg by mouth 2 (two) times daily.    . citalopram (CELEXA) 20 MG tablet Take 20 mg by mouth daily.      . Cyanocobalamin (VITAMIN B 12 PO) Take 1 tablet by mouth every other day.    . nitroGLYCERIN (NITROSTAT) 0.4 MG SL tablet Place 1 tablet (0.4 mg total) under the tongue every 5 (five) minutes as needed for chest pain. 25 tablet 3  . omeprazole (PRILOSEC) 20 MG capsule Take 20 mg by mouth daily.    . ranolazine (RANEXA) 500 MG 12 hr tablet Take 1 tablet (500 mg total) by mouth 2 (two) times daily. 180 tablet 3  . SYMBICORT 160-4.5  MCG/ACT inhaler 1 puff 2 (two) times daily.      No current facility-administered medications on file prior to visit.   Allergies  Allergen Reactions  . Isosorbide Mononitrate     SEVER HEAD ACH ; PT COULD NOT SLEEP FOR 2 DAYS  . Penicillins Rash  . Ambien [Zolpidem Tartrate]     hallucination       ROS: See HPI for pertinent positives and negatives.  Physical Examination  Filed Vitals:   08/08/14 0915  BP: 120/87  Pulse: 74  Resp: 16  Height: 5\' 11"  (1.803 m)  Weight: 172 lb (78.019 kg)  SpO2: 99%   Body mass index is 24 kg/(m^2).   General: A&O x 3, WD.  Pulmonary: Sym exp, good air movt, CTAB, no rales, rhonchi, or wheezing.  Cardiac: RRR, Nl S1, S2, no detected murmur.   Carotid Bruits  Left  Right    Negative  Negative   Aorta is not palpable  Radial pulses are 1+ palpable and =  VASCULAR EXAM:  LE Pulses  LEFT  RIGHT   FEMORAL  2+palpable  2+palpable   POPLITEAL  not palpable  not palpable   POSTERIOR TIBIAL  2+ palpable  2+palpable   DORSALIS PEDIS  ANTERIOR TIBIAL  not palpable  not palpable    Gastrointestinal: soft, NTND, -G/R, - HSM, - palpable masses, - CVAT B.  Musculoskeletal: M/S 5/5 throughout , Extremities without ischemic changes.  Neurologic: CN 2-12 intact, Pain and light touch intact in extremities are intact except , Motor exam as listed above.         Non-Invasive Vascular Imaging  AAA Duplex (08/08/2014) ABDOMINAL AORTA DUPLEX EVALUATION    INDICATION: Follow up aneurysm    PREVIOUS INTERVENTION(S): None    DUPLEX EXAM:     LOCATION DIAMETER AP (cm) DIAMETER TRANSVERSE (cm) VELOCITIES (cm/sec)  Aorta Proximal 2.6 - 55  Aorta Mid 4.4 4.8 24  Aorta Distal 2.8 2.6 48  Right Common Iliac Artery 2.0 1.6 44  Left Common Iliac Artery 1.6 - 64    Previous max aortic diameter:  4.2 x 4.4 Date: 01/31/2014     ADDITIONAL FINDINGS:     IMPRESSION: 1. 4.4 x 4.8 cm mid aortic aneurysm    Compared  to the previous exam:  Increase of size in the aneurysm and right common iliac artery     Medical Decision Making  The patient  is a 74 y.o. male who presents with asymptomatic AAA with an increase in size from 4.4 to 4.8 cm in six months. Unfortunately he continues to smoke which is a primary risk factor for aneurysmal growth. The patient was counseled re smoking cessation and given several free resources re smoking cessation.   Based on this patient's exam and diagnostic studies, the patient will follow up in 6 months  with the following studies: AAA Duplex.  Consideration for repair of AAA would be made when the size is 5.5 cm, growth > 1 cm/yr, and symptomatic status.  I emphasized the importance of maximal medical management including strict control of blood pressure, blood glucose, and lipid levels, antiplatelet agents, obtaining regular exercise, and cessation of smoking.   The patient was given information about AAA including signs, symptoms, treatment, and how to minimize the risk of enlargement and rupture of aneurysms.    The patient was advised to call 911 should the patient experience sudden onset abdominal or back pain.   Thank you for allowing [place] to participate in this patient's care.  Clemon Chambers, RN, MSN, FNP-C [facility] Office: [phone]  Clinic Physician: Bridgett Larsson  08/08/2014, 9:10 AM

## 2014-08-08 NOTE — Addendum Note (Signed)
Addended by: Mena Goes on: 08/08/2014 04:45 PM   Modules accepted: Orders

## 2014-12-19 HISTORY — PX: SPINE SURGERY: SHX786

## 2015-02-06 ENCOUNTER — Other Ambulatory Visit (HOSPITAL_COMMUNITY): Payer: Medicare Other

## 2015-02-06 ENCOUNTER — Ambulatory Visit: Payer: Medicare Other | Admitting: Family

## 2015-02-11 ENCOUNTER — Ambulatory Visit (INDEPENDENT_AMBULATORY_CARE_PROVIDER_SITE_OTHER): Payer: Medicare Other | Admitting: Cardiovascular Disease

## 2015-02-11 ENCOUNTER — Encounter: Payer: Self-pay | Admitting: Cardiovascular Disease

## 2015-02-11 VITALS — BP 119/73 | HR 64 | Ht 71.0 in | Wt 165.0 lb

## 2015-02-11 DIAGNOSIS — I714 Abdominal aortic aneurysm, without rupture, unspecified: Secondary | ICD-10-CM

## 2015-02-11 DIAGNOSIS — I25118 Atherosclerotic heart disease of native coronary artery with other forms of angina pectoris: Secondary | ICD-10-CM | POA: Diagnosis not present

## 2015-02-11 DIAGNOSIS — I2510 Atherosclerotic heart disease of native coronary artery without angina pectoris: Secondary | ICD-10-CM | POA: Diagnosis not present

## 2015-02-11 DIAGNOSIS — E785 Hyperlipidemia, unspecified: Secondary | ICD-10-CM

## 2015-02-11 DIAGNOSIS — I452 Bifascicular block: Secondary | ICD-10-CM

## 2015-02-11 DIAGNOSIS — I25812 Atherosclerosis of bypass graft of coronary artery of transplanted heart without angina pectoris: Secondary | ICD-10-CM

## 2015-02-11 DIAGNOSIS — R5383 Other fatigue: Secondary | ICD-10-CM

## 2015-02-11 DIAGNOSIS — Z720 Tobacco use: Secondary | ICD-10-CM

## 2015-02-11 DIAGNOSIS — I10 Essential (primary) hypertension: Secondary | ICD-10-CM

## 2015-02-11 NOTE — Patient Instructions (Signed)
Continue all current medications. Your physician wants you to follow up in: 6 months.  You will receive a reminder letter in the mail one-two months in advance.  If you don't receive a letter, please call our office to schedule the follow up appointment   

## 2015-02-11 NOTE — Progress Notes (Signed)
Patient ID: Troy [name], male   DOB: 1941/04/03, 74 y.o.   MRN: [id number]      SUBJECTIVE: The patient presents for routine cardiovascular follow up. He has a history of coronary artery disease and CABG in December 2012, essential hypertension, hyperlipidemia, and tobacco abuse, as well as an abdominal aortic aneurysm most recently measuring at 4.4 x 4.8 cm on 2/19//2016. He also has a history of anemia. He previously developed headaches and nausea with isosorbide dinitrate, and this was subsequently switched to Ranexa.  Lexiscan Cardiolite stress test in 10/2013 demonstrated myocardial scar, with no evidence of ischemia.  He experiences episodic chest pain much less frequently with less shortness of breath. He does feel fatigued when walking from the mailbox and back to his house. This has remained relatively stable.   He recently returned from a trip to [facility] He tries to stay out of the heat and humidity. He said he had his cholesterol checked by his PCP this year. He continues to smoke a half pack of cigarettes daily. His primary complaints relate to bilateral hip arthritis and thoracic spine pain.  ECG performed in the office today demonstrates normal sinus rhythm with a right bundle branch block and left anterior fascicular block.  Review of Systems: As per "subjective", otherwise negative.  Allergies  Allergen Reactions  . Isosorbide Mononitrate     SEVER HEAD ACH ; PT COULD NOT SLEEP FOR 2 DAYS  . Penicillins Rash  . Ambien [Zolpidem Tartrate]     hallucination       Current Outpatient Prescriptions  Medication Sig Dispense Refill  . aspirin 81 MG tablet Take 81 mg by mouth daily.     [name] atenolol (TENORMIN) 50 MG tablet Take 50 mg by mouth daily.      . calcium carbonate (OS-CAL) 600 MG TABS Take 600 mg by mouth 2 (two) times daily.    . citalopram (CELEXA) 20 MG tablet Take 20 mg by mouth daily.      . Cyanocobalamin (VITAMIN B 12 PO) Take 1 tablet by mouth every  other day.    . lovastatin (MEVACOR) 20 MG tablet Take 1 tablet by mouth daily.    . Multiple Vitamins-Minerals (CENTRUM SILVER PO) Take 1 tablet by mouth daily.    . nitroGLYCERIN (NITROSTAT) 0.4 MG SL tablet Place 1 tablet (0.4 mg total) under the tongue every 5 (five) minutes as needed for chest pain. 25 tablet 3  . ranolazine (RANEXA) 500 MG 12 hr tablet Take 1 tablet (500 mg total) by mouth 2 (two) times daily. 180 tablet 3  . SYMBICORT 160-4.5 MCG/ACT inhaler 1 puff 2 (two) times daily.      No current facility-administered medications for this visit.    Past Medical History  Diagnosis Date  . COPD (chronic obstructive pulmonary disease)   . Hypertension   . Hyperlipidemia   . Ulcer   . Reflux   . AAA (abdominal aortic aneurysm)   . CAD (coronary artery disease)     cath 11.30.2012 - 3vd  . Bilateral cataracts   . Retinal detachment     history of right detachment  . Angina   . Lung nodule     SPOT ON LUNG NOT DIAGNOSTED AS CANCER KEEPING AN EYE ON IT  . CHF (congestive heart failure)   . GERD (gastroesophageal reflux disease)   . Hiatal hernia   . Depression   . Status post coronary artery bypass grafting     surgery May 24, 2011 by Dr. Roxan Hockey.  [name] Postoperative atrial fibrillation     amiodarone therapy  . Back fracture   . 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 Lung surgery  1964  . Nose surgery    . Coronary angioplasty  1992    Stent  . Eye surgery      [name] DETACHED HAD SURGERY FOR IT  . Coronary artery bypass graft  05/24/2011    Procedure: CORONARY ARTERY BYPASS GRAFTING (CABG);  Surgeon: Melrose Nakayama, MD;  Location: [facility];  Service: Open Heart Surgery;  Laterality: N/A;  Coronary Artery Bypass Graft on pump times five, utlizing left internal mammary artery and right saphenous vein harvested endoscopically   . Lesion removal N/A 07/29/2013    Procedure: EXCISION OF MALIGNANT SKIN  LESION BACK;  Surgeon: Jamesetta So, MD;  Location: [facility];  Service: General;  Laterality: N/A;    Social History   Social History  . Marital Status: Married    Spouse Name: N/A  . Number of Children: N/A  . Years of Education: N/A   Occupational History  . Not on file.   Social History Main Topics  . Smoking status: Light Tobacco Smoker -- 1.00 packs/day for 56 years    Types: Cigarettes    Start date: 02/15/1956  . Smokeless tobacco: Never Used  . Alcohol Use: 4.2 oz/week    7 Shots of liquor per week  . Drug Use: No  . Sexual Activity: Yes   Other Topics Concern  . Not on file   Social History Narrative     Filed Vitals:   02/11/15 1017  BP: 119/73  Pulse: 64  Height: 5\' 11"  (1.803 m)  Weight: 165 lb (74.844 kg)    PHYSICAL EXAM General: NAD HEENT: Normal. Neck: No JVD, no thyromegaly. Lungs: Diminished but without rales or wheezes. CV: Nondisplaced PMI.  Regular rate and rhythm, normal S1/S2, no S3/S4, no murmur. No pretibial or periankle edema.  No carotid bruit.   Abdomen: Soft, nontender, no distention.  Neurologic: Alert and oriented x 3.  Psych: Normal affect. Skin: Normal. Musculoskeletal: No gross deformities. Extremities: No clubbing or cyanosis.   ECG: Most recent ECG reviewed.      ASSESSMENT AND PLAN: 1. CAD/CABG: Stable ischemic heart disease. Echo in 11/2011 revealed normal LV systolic function, EF 16-10%. He is on ASA, atenolol, Ranexa, and had been on atorvastatin but now on lovastatin. He did not tolerate isosorbide dinitrate in the past. Lexiscan Cardiolite stress test in 10/2013 demonstrated myocardial scar with no evidence of ischemia. I would have a low threshold for proceeding with coronary angiography if his symptoms were to increase in frequency/severity.  2. Essential hypertension: Blood pressure is at target on current medical therapy. No changes are indicated.   3. Hyperlipidemia: He said he had his lipids checked by his  PCP. I will try and obtain these results.   4. AAA: This is followed by Dr. Bridgett Larsson in [place]. Results noted above from 07/2014 (4.8 x 4.4 cm)  5. Tobacco abuse: He currently smokes 10 cigarettes daily. I previously gave him extensive counseling on tobacco cessation and on the deleterious effects of continued nicotine use.   Dispo: f/u 6 months.   Kate Sable, M.D., F.A.C.C.

## 2015-02-13 ENCOUNTER — Encounter: Payer: Self-pay | Admitting: Family

## 2015-02-16 ENCOUNTER — Ambulatory Visit (HOSPITAL_COMMUNITY)
Admission: RE | Admit: 2015-02-16 | Discharge: 2015-02-16 | Disposition: A | Discharge status: Home / Self Care | Payer: Medicare Other | Source: Ambulatory Visit | Attending: Family | Admitting: Family

## 2015-02-16 ENCOUNTER — Encounter: Payer: Self-pay | Admitting: Family

## 2015-02-16 ENCOUNTER — Ambulatory Visit (INDEPENDENT_AMBULATORY_CARE_PROVIDER_SITE_OTHER): Payer: Medicare Other | Admitting: Family

## 2015-02-16 VITALS — BP 118/78 | HR 62 | Temp 98.1°F | Resp 16 | Ht 71.0 in | Wt 164.0 lb

## 2015-02-16 DIAGNOSIS — I714 Abdominal aortic aneurysm, without rupture, unspecified: Secondary | ICD-10-CM

## 2015-02-16 DIAGNOSIS — F17200 Nicotine dependence, unspecified, uncomplicated: Secondary | ICD-10-CM

## 2015-02-16 DIAGNOSIS — Z720 Tobacco use: Secondary | ICD-10-CM

## 2015-02-16 DIAGNOSIS — I25812 Atherosclerosis of bypass graft of coronary artery of transplanted heart without angina pectoris: Secondary | ICD-10-CM

## 2015-02-16 NOTE — Addendum Note (Signed)
Addended by: Dorthula Rue L on: 02/16/2015 09:57 AM   Modules accepted: Orders

## 2015-02-16 NOTE — Patient Instructions (Signed)
Abdominal Aortic Aneurysm An aneurysm is a weakened or damaged part of an artery wall that bulges from the normal force of blood pumping through the body. An abdominal aortic aneurysm is an aneurysm that occurs in the lower part of the aorta, the main artery of the body.  The major concern with an abdominal aortic aneurysm is that it can enlarge and burst (rupture) or blood can flow between the layers of the wall of the aorta through a tear (aorticdissection). Both of these conditions can cause bleeding inside the body and can be life threatening unless diagnosed and treated promptly. CAUSES  The exact cause of an abdominal aortic aneurysm is unknown. Some contributing factors are:   A hardening of the arteries caused by the buildup of fat and other substances in the lining of a blood vessel (arteriosclerosis).  Inflammation of the walls of an artery (arteritis).   Connective tissue diseases, such as Marfan syndrome.   Abdominal trauma.   An infection, such as syphilis or staphylococcus, in the wall of the aorta (infectious aortitis) caused by bacteria. RISK FACTORS  Risk factors that contribute to an abdominal aortic aneurysm may include:  Age older than 60 years.   High blood pressure (hypertension).  Male gender.  Ethnicity (white race).  Obesity.  Family history of aneurysm (first degree relatives only).  Tobacco use. PREVENTION  The following healthy lifestyle habits may help decrease your risk of abdominal aortic aneurysm:  Quitting smoking. Smoking can raise your blood pressure and cause arteriosclerosis.  Limiting or avoiding alcohol.  Keeping your blood pressure, blood sugar level, and cholesterol levels within normal limits.  Decreasing your salt intake. In somepeople, too much salt can raise blood pressure and increase your risk of abdominal aortic aneurysm.  Eating a diet low in saturated fats and cholesterol.  Increasing your fiber intake by including  whole grains, vegetables, and fruits in your diet. Eating these foods may help lower blood pressure.  Maintaining a healthy weight.  Staying physically active and exercising regularly. SYMPTOMS  The symptoms of abdominal aortic aneurysm may vary depending on the size and rate of growth of the aneurysm.Most grow slowly and do not have any symptoms. When symptoms do occur, they may include:  Pain (abdomen, side, lower back, or groin). The pain may vary in intensity. A sudden onset of severe pain may indicate that the aneurysm has ruptured.  Feeling full after eating only small amounts of food.  Nausea or vomiting or both.  Feeling a pulsating lump in the abdomen.  Feeling faint or passing out. DIAGNOSIS  Since most unruptured abdominal aortic aneurysms have no symptoms, they are often discovered during diagnostic exams for other conditions. An aneurysm may be found during the following procedures:  Ultrasonography (A one-time screening for abdominal aortic aneurysm by ultrasonography is also recommended for all men aged 65-75 years who have ever smoked).  X-ray exams.  A computed tomography (CT).  Magnetic resonance imaging (MRI).  Angiography or arteriography. TREATMENT  Treatment of an abdominal aortic aneurysm depends on the size of your aneurysm, your age, and risk factors for rupture. Medication to control blood pressure and pain may be used to manage aneurysms smaller than 6 cm. Regular monitoring for enlargement may be recommended by your caregiver if:  The aneurysm is 3-4 cm in size (an annual ultrasonography may be recommended).  The aneurysm is 4-4.5 cm in size (an ultrasonography every 6 months may be recommended).  The aneurysm is larger than 4.5 cm in   size (your caregiver may ask that you be examined by a vascular surgeon). If your aneurysm is larger than 6 cm, surgical repair may be recommended. There are two main methods for repair of an aneurysm:   Endovascular  repair (a minimally invasive surgery). This is done most often.  Open repair. This method is used if an endovascular repair is not possible. Document Released: 03/16/2005 Document Revised: 10/01/2012 Document Reviewed: 07/06/2012 ExitCare Patient Information 2015 [employer]. This information is not intended to replace advice given to you by your health care provider. Make sure you discuss any questions you have with your health care provider.   Smoking Cessation Quitting smoking is important to your health and has many advantages. However, it is not always easy to quit since nicotine is a very addictive drug. Oftentimes, people try 3 times or more before being able to quit. This document explains the best ways for you to prepare to quit smoking. Quitting takes hard work and a lot of effort, but you can do it. ADVANTAGES OF QUITTING SMOKING  You will live longer, feel better, and live better.  Your body will feel the impact of quitting smoking almost immediately.  Within 20 minutes, blood pressure decreases. Your pulse returns to its normal level.  After 8 hours, carbon monoxide levels in the blood return to normal. Your oxygen level increases.  After 24 hours, the chance of having a heart attack starts to decrease. Your breath, hair, and body stop smelling like smoke.  After 48 hours, damaged nerve endings begin to recover. Your sense of taste and smell improve.  After 72 hours, the body is virtually free of nicotine. Your bronchial tubes relax and breathing becomes easier.  After 2 to 12 weeks, lungs can hold more air. Exercise becomes easier and circulation improves.  The risk of having a heart attack, stroke, cancer, or lung disease is greatly reduced.  After 1 year, the risk of coronary heart disease is cut in half.  After 5 years, the risk of stroke falls to the same as a nonsmoker.  After 10 years, the risk of lung cancer is cut in half and the risk of other cancers  decreases significantly.  After 15 years, the risk of coronary heart disease drops, usually to the level of a nonsmoker.  If you are pregnant, quitting smoking will improve your chances of having a healthy baby.  The people you live with, especially any children, will be healthier.  You will have extra money to spend on things other than cigarettes. QUESTIONS TO THINK ABOUT BEFORE ATTEMPTING TO QUIT You may want to talk about your answers with your health care provider.  Why do you want to quit?  If you tried to quit in the past, what helped and what did not?  What will be the most difficult situations for you after you quit? How will you plan to handle them?  Who can help you through the tough times? Your family? Friends? A health care provider?  What pleasures do you get from smoking? What ways can you still get pleasure if you quit? Here are some questions to ask your health care provider:  How can you help me to be successful at quitting?  What medicine do you think would be best for me and how should I take it?  What should I do if I need more help?  What is smoking withdrawal like? How can I get information on withdrawal? GET READY  Set a quit   date.  Change your environment by getting rid of all cigarettes, ashtrays, matches, and lighters in your home, car, or work. Do not let people smoke in your home.  Review your past attempts to quit. Think about what worked and what did not. GET SUPPORT AND ENCOURAGEMENT You have a better chance of being successful if you have help. You can get support in many ways.  Tell your family, friends, and coworkers that you are going to quit and need their support. Ask them not to smoke around you.  Get individual, group, or telephone counseling and support. Programs are available at local hospitals and health centers. Call your local health department for information about programs in your area.  Spiritual beliefs and practices may  help some smokers quit.  Download a "quit meter" on your computer to keep track of quit statistics, such as how long you have gone without smoking, cigarettes not smoked, and money saved.  Get a self-help book about quitting smoking and staying off tobacco. LEARN NEW SKILLS AND BEHAVIORS  Distract yourself from urges to smoke. Talk to someone, go for a walk, or occupy your time with a task.  Change your normal routine. Take a different route to work. Drink tea instead of coffee. Eat breakfast in a different place.  Reduce your stress. Take a hot bath, exercise, or read a book.  Plan something enjoyable to do every day. Reward yourself for not smoking.  Explore interactive web-based programs that specialize in helping you quit. GET MEDICINE AND USE IT CORRECTLY Medicines can help you stop smoking and decrease the urge to smoke. Combining medicine with the above behavioral methods and support can greatly increase your chances of successfully quitting smoking.  Nicotine replacement therapy helps deliver nicotine to your body without the negative effects and risks of smoking. Nicotine replacement therapy includes nicotine gum, lozenges, inhalers, nasal sprays, and skin patches. Some may be available over-the-counter and others require a prescription.  Antidepressant medicine helps people abstain from smoking, but how this works is unknown. This medicine is available by prescription.  Nicotinic receptor partial agonist medicine simulates the effect of nicotine in your brain. This medicine is available by prescription. Ask your health care provider for advice about which medicines to use and how to use them based on your health history. Your health care provider will tell you what side effects to look out for if you choose to be on a medicine or therapy. Carefully read the information on the package. Do not use any other product containing nicotine while using a nicotine replacement product.    RELAPSE OR DIFFICULT SITUATIONS Most relapses occur within the first 3 months after quitting. Do not be discouraged if you start smoking again. Remember, most people try several times before finally quitting. You may have symptoms of withdrawal because your body is used to nicotine. You may crave cigarettes, be irritable, feel very hungry, cough often, get headaches, or have difficulty concentrating. The withdrawal symptoms are only temporary. They are strongest when you first quit, but they will go away within 10-14 days. To reduce the chances of relapse, try to:  Avoid drinking alcohol. Drinking lowers your chances of successfully quitting.  Reduce the amount of caffeine you consume. Once you quit smoking, the amount of caffeine in your body increases and can give you symptoms, such as a rapid heartbeat, sweating, and anxiety.  Avoid smokers because they can make you want to smoke.  Do not let weight gain distract you. Many   smokers will gain weight when they quit, usually less than 10 pounds. Eat a healthy diet and stay active. You can always lose the weight gained after you quit.  Find ways to improve your mood other than smoking. FOR MORE INFORMATION  www.smokefree.gov  Document Released: 05/31/2001 Document Revised: 10/21/2013 Document Reviewed: 09/15/2011 ExitCare Patient Information 2015 [employer]. This information is not intended to replace advice given to you by your health care provider. Make sure you discuss any questions you have with your health care provider.   Smoking Cessation, Tips for Success If you are ready to quit smoking, congratulations! You have chosen to help yourself be healthier. Cigarettes bring nicotine, tar, carbon monoxide, and other irritants into your body. Your lungs, heart, and blood vessels will be able to work better without these poisons. There are many different ways to quit smoking. Nicotine gum, nicotine patches, a nicotine inhaler, or nicotine  nasal spray can help with physical craving. Hypnosis, support groups, and medicines help break the habit of smoking. WHAT THINGS CAN I DO TO MAKE QUITTING EASIER?  Here are some tips to help you quit for good:  Pick a date when you will quit smoking completely. Tell all of your friends and family about your plan to quit on that date.  Do not try to slowly cut down on the number of cigarettes you are smoking. Pick a quit date and quit smoking completely starting on that day.  Throw away all cigarettes.   Clean and remove all ashtrays from your home, work, and car.  On a card, write down your reasons for quitting. Carry the card with you and read it when you get the urge to smoke.  Cleanse your body of nicotine. Drink enough water and fluids to keep your urine clear or pale yellow. Do this after quitting to flush the nicotine from your body.  Learn to predict your moods. Do not let a bad situation be your excuse to have a cigarette. Some situations in your life might tempt you into wanting a cigarette.  Never have "just one" cigarette. It leads to wanting another and another. Remind yourself of your decision to quit.  Change habits associated with smoking. If you smoked while driving or when feeling stressed, try other activities to replace smoking. Stand up when drinking your coffee. Brush your teeth after eating. Sit in a different chair when you read the paper. Avoid alcohol while trying to quit, and try to drink fewer caffeinated beverages. Alcohol and caffeine may urge you to smoke.  Avoid foods and drinks that can trigger a desire to smoke, such as sugary or spicy foods and alcohol.  Ask people who smoke not to smoke around you.  Have something planned to do right after eating or having a cup of coffee. For example, plan to take a walk or exercise.  Try a relaxation exercise to calm you down and decrease your stress. Remember, you may be tense and nervous for the first 2 weeks after  you quit, but this will pass.  Find new activities to keep your hands busy. Play with a pen, coin, or rubber band. Doodle or draw things on paper.  Brush your teeth right after eating. This will help cut down on the craving for the taste of tobacco after meals. You can also try mouthwash.   Use oral substitutes in place of cigarettes. Try using lemon drops, carrots, cinnamon sticks, or chewing gum. Keep them handy so they are available when you   have the urge to smoke.  When you have the urge to smoke, try deep breathing.  Designate your home as a nonsmoking area.  If you are a heavy smoker, ask your health care provider about a prescription for nicotine chewing gum. It can ease your withdrawal from nicotine.  Reward yourself. Set aside the cigarette money you save and buy yourself something nice.  Look for support from others. Join a support group or smoking cessation program. Ask someone at home or at work to help you with your plan to quit smoking.  Always ask yourself, "Do I need this cigarette or is this just a reflex?" Tell yourself, "Today, I choose not to smoke," or "I do not want to smoke." You are reminding yourself of your decision to quit.  Do not replace cigarette smoking with electronic cigarettes (commonly called e-cigarettes). The safety of e-cigarettes is unknown, and some may contain harmful chemicals.  If you relapse, do not give up! Plan ahead and think about what you will do the next time you get the urge to smoke. HOW WILL I FEEL WHEN I QUIT SMOKING? You may have symptoms of withdrawal because your body is used to nicotine (the addictive substance in cigarettes). You may crave cigarettes, be irritable, feel very hungry, cough often, get headaches, or have difficulty concentrating. The withdrawal symptoms are only temporary. They are strongest when you first quit but will go away within 10-14 days. When withdrawal symptoms occur, stay in control. Think about your reasons  for quitting. Remind yourself that these are signs that your body is healing and getting used to being without cigarettes. Remember that withdrawal symptoms are easier to treat than the major diseases that smoking can cause.  Even after the withdrawal is over, expect periodic urges to smoke. However, these cravings are generally short lived and will go away whether you smoke or not. Do not smoke! WHAT RESOURCES ARE AVAILABLE TO HELP ME QUIT SMOKING? Your health care provider can direct you to community resources or hospitals for support, which may include:  Group support.  Education.  Hypnosis.  Therapy. Document Released: 03/04/2004 Document Revised: 10/21/2013 Document Reviewed: 11/22/2012 ExitCare Patient Information 2015 [employer]. This information is not intended to replace advice given to you by your health care provider. Make sure you discuss any questions you have with your health care provider.   

## 2015-02-16 NOTE — Progress Notes (Signed)
[facility]  Established Abdominal Aortic Aneurysm  History of Present Illness  Troy [name] is a 74 y.o. ([birth year]) male patient of Dr. Bridgett Larsson who presents with chief complaint: follow up for AAA. Previous studies demonstrate an AAA, measuring 4.59 cm. The patient does not have back or abdominal pain. The patient is a smoker.  He does not have any abdominal or back pain referable to AAA.  He had TIA's years ago, none recently.  He denies claudication symptoms in legs, denies non healing wounds.  He had a CABG X 5 vessels in 2012, denies hx of MI.    His taking iron pills for anemia. He has lumabr spine surgery, Dr. 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  Pt Diabetic: No  Pt smoker: smokes 5 cigarettes/day, decreased from 8 at previous visit, was up to 3 ppd, started at age 62    Past Medical History  Diagnosis Date  . COPD (chronic obstructive pulmonary disease)   . Hypertension   . Hyperlipidemia   . Ulcer   . Reflux   . AAA (abdominal aortic aneurysm)   . CAD (coronary artery disease)     cath 11.30.2012 - 3vd  . Bilateral cataracts   . Retinal detachment     history of right detachment  . Angina   . Lung nodule     SPOT ON LUNG NOT DIAGNOSTED AS CANCER KEEPING AN EYE ON IT  . CHF (congestive heart failure)   . GERD (gastroesophageal reflux disease)   . Hiatal hernia   . Depression   . Status post coronary artery bypass grafting     surgery May 24, 2011 by Dr. Roxan Hockey.  [name] Postoperative atrial fibrillation     amiodarone therapy  . Back fracture   . 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 Lung surgery  1964  . Nose surgery    . Coronary angioplasty  1992    Stent  . Eye surgery      [name] DETACHED HAD SURGERY FOR IT  . Coronary artery bypass graft  05/24/2011    Procedure: CORONARY ARTERY BYPASS GRAFTING (CABG);   Surgeon: Melrose Nakayama, MD;  Location: [facility];  Service: Open Heart Surgery;  Laterality: N/A;  Coronary Artery Bypass Graft on pump times five, utlizing left internal mammary artery and right saphenous vein harvested endoscopically   . Lesion removal N/A 07/29/2013    Procedure: EXCISION OF MALIGNANT SKIN LESION BACK;  Surgeon: Jamesetta So, MD;  Location: [facility];  Service: General;  Laterality: N/A;  . Spine surgery  July 2016    3rd Vert.   Social History Social History   Social History  . Marital Status: Married    Spouse Name: N/A  . Number of Children: N/A  . Years of Education: N/A   Occupational History  . Not on file.   Social History Main Topics  . Smoking status: Light Tobacco Smoker -- 1.00 packs/day for 56 years    Types: Cigarettes    Start date: 02/15/1956  . Smokeless tobacco: Never Used  . Alcohol Use: 4.2 oz/week    7 Shots of liquor per week  . Drug Use: No  . Sexual Activity: Yes   Other Topics Concern  . Not on file   Social History Narrative   Family History Family History  Problem  Relation Age of Onset  . Heart disease Father     Before age 26-Heart Attack  . Heart attack Father   . Heart disease Sister   . Heart disease Brother   . Heart attack Brother     Current Outpatient Prescriptions on File Prior to Visit  Medication Sig Dispense Refill  . aspirin 81 MG tablet Take 81 mg by mouth daily.     [name] atenolol (TENORMIN) 50 MG tablet Take 50 mg by mouth daily.      . calcium carbonate (OS-CAL) 600 MG TABS Take 600 mg by mouth 2 (two) times daily.    . citalopram (CELEXA) 20 MG tablet Take 20 mg by mouth daily.      . Cyanocobalamin (VITAMIN B 12 PO) Take 1 tablet by mouth every other day.    . lovastatin (MEVACOR) 20 MG tablet Take 1 tablet by mouth daily.    . Multiple Vitamins-Minerals (CENTRUM SILVER PO) Take 1 tablet by mouth daily.    . nitroGLYCERIN (NITROSTAT) 0.4 MG SL tablet Place 1 tablet (0.4 mg total) under the tongue every 5  (five) minutes as needed for chest pain. 25 tablet 3  . ranolazine (RANEXA) 500 MG 12 hr tablet Take 1 tablet (500 mg total) by mouth 2 (two) times daily. 180 tablet 3  . SYMBICORT 160-4.5 MCG/ACT inhaler 1 puff 2 (two) times daily.      No current facility-administered medications on file prior to visit.   Allergies  Allergen Reactions  . Isosorbide Mononitrate     SEVER HEAD ACH ; PT COULD NOT SLEEP FOR 2 DAYS  . Penicillins Rash  . Ambien [Zolpidem Tartrate]     hallucination       ROS: See HPI for pertinent positives and negatives.  Physical Examination  Filed Vitals:   02/16/15 0906  BP: 118/78  Pulse: 62  Temp: 98.1 F (36.7 C)  TempSrc: Oral  Resp: 16  Height: 5\' 11"  (1.803 m)  Weight: 164 lb (74.39 kg)  SpO2: 98%   Body mass index is 22.88 kg/(m^2).  General: A&O x 3, WD.  Pulmonary: Sym exp, limited air movt, CTAB, no rales, rhonchi, or wheezing.  Cardiac: RRR, Nl S1, S2, no detected murmur.   Carotid Bruits  Left  Right    Negative  Negative   Aorta is not palpable  Radial pulses are 1+ palpable and =  VASCULAR EXAM:  LE Pulses  LEFT  RIGHT   FEMORAL  2+palpable  2+palpable   POPLITEAL  not palpable  not palpable   POSTERIOR TIBIAL  not palpable  Not palpable   DORSALIS PEDIS  ANTERIOR TIBIAL  not palpable  not palpable    Gastrointestinal: soft, NTND, -G/R, - HSM, - palpable masses, - CVAT B.  Musculoskeletal: M/S 5/5 throughout , Extremities without ischemic changes. Feet are pink, toes of both feet with brisk capillary refill.  Neurologic: CN 2-12 intact, Pain and light touch intact in extremities are intact, Motor exam as listed above.             Non-Invasive Vascular Imaging  AAA Duplex (02/16/2015) ABDOMINAL AORTA DUPLEX EVALUATION    INDICATION: Follow-up abdominal aortic aneurysm     PREVIOUS INTERVENTION(S):     DUPLEX EXAM:     LOCATION DIAMETER AP (cm) DIAMETER TRANSVERSE  (cm) VELOCITIES (cm/sec)  Aorta Proximal 2.44 2.53 47  Aorta Mid 4.6 4.7 32  Aorta Distal 2.81 2.80 36  Right Common Iliac Artery 2.0  34  Left Common Iliac Artery 1.34  59    Previous max aortic diameter:  4.4cm x 4.8cm Date: 08/08/2014  ADDITIONAL FINDINGS:     IMPRESSION: 1. Stable abdominal aortic aneurysm measuring 4.6cm x 4.7cm on today's exam. 2. Ectasia of right distal common iliac artery measures 2.0cm.    Compared to the previous exam:  No significant change compared to prior exam.      Medical Decision Making  The patient is a 74 y.o. male who presents with asymptomatic AAA with no increase in size.  The patient was counseled re smoking cessation and given several free resources re smoking cessation.   Based on this patient's exam and diagnostic studies, the patient will follow up in 6 months  with the following studies: AAA Duplex.  Consideration for repair of AAA would be made when the size is 5.5 cm, growth > 1 cm/yr, and symptomatic status.  I emphasized the importance of maximal medical management including strict control of blood pressure, blood glucose, and lipid levels, antiplatelet agents, obtaining regular exercise, and cessation of smoking.   The patient was given information about AAA including signs, symptoms, treatment, and how to minimize the risk of enlargement and rupture of aneurysms.    The patient was advised to call 911 should the patient experience sudden onset abdominal or back pain.   Thank you for allowing [place] to participate in this patient's care.  Clemon Chambers, RN, MSN, FNP-C [facility] Office: [facility] Physician: Trula Slade  02/16/2015, 9:14 AM

## 2015-08-03 ENCOUNTER — Ambulatory Visit: Payer: Medicare Other | Admitting: Cardiovascular Disease

## 2015-08-21 ENCOUNTER — Ambulatory Visit: Payer: Medicare Other | Admitting: Family

## 2015-08-21 ENCOUNTER — Other Ambulatory Visit (HOSPITAL_COMMUNITY): Payer: Medicare Other

## 2015-09-07 ENCOUNTER — Encounter: Payer: Self-pay | Admitting: Family

## 2015-09-14 ENCOUNTER — Ambulatory Visit (HOSPITAL_COMMUNITY)
Admission: RE | Admit: 2015-09-14 | Discharge: 2015-09-14 | Disposition: A | Discharge status: Home / Self Care | Payer: Medicare Other | Source: Ambulatory Visit | Attending: Family | Admitting: Family

## 2015-09-14 ENCOUNTER — Encounter: Payer: Self-pay | Admitting: Family

## 2015-09-14 ENCOUNTER — Other Ambulatory Visit: Payer: Self-pay | Admitting: *Deleted

## 2015-09-14 ENCOUNTER — Ambulatory Visit (INDEPENDENT_AMBULATORY_CARE_PROVIDER_SITE_OTHER): Payer: Medicare Other | Admitting: Family

## 2015-09-14 VITALS — BP 118/80 | HR 67 | Temp 97.2°F | Resp 18 | Ht 71.0 in | Wt 164.1 lb

## 2015-09-14 DIAGNOSIS — I2510 Atherosclerotic heart disease of native coronary artery without angina pectoris: Secondary | ICD-10-CM | POA: Insufficient documentation

## 2015-09-14 DIAGNOSIS — F17200 Nicotine dependence, unspecified, uncomplicated: Secondary | ICD-10-CM

## 2015-09-14 DIAGNOSIS — E785 Hyperlipidemia, unspecified: Secondary | ICD-10-CM | POA: Insufficient documentation

## 2015-09-14 DIAGNOSIS — J449 Chronic obstructive pulmonary disease, unspecified: Secondary | ICD-10-CM | POA: Insufficient documentation

## 2015-09-14 DIAGNOSIS — Z720 Tobacco use: Secondary | ICD-10-CM

## 2015-09-14 DIAGNOSIS — F329 Major depressive disorder, single episode, unspecified: Secondary | ICD-10-CM | POA: Diagnosis not present

## 2015-09-14 DIAGNOSIS — I509 Heart failure, unspecified: Secondary | ICD-10-CM | POA: Diagnosis not present

## 2015-09-14 DIAGNOSIS — I110 Hypertensive heart disease with heart failure: Secondary | ICD-10-CM | POA: Insufficient documentation

## 2015-09-14 DIAGNOSIS — K219 Gastro-esophageal reflux disease without esophagitis: Secondary | ICD-10-CM | POA: Diagnosis not present

## 2015-09-14 DIAGNOSIS — I714 Abdominal aortic aneurysm, without rupture, unspecified: Secondary | ICD-10-CM

## 2015-09-14 DIAGNOSIS — I723 Aneurysm of iliac artery: Secondary | ICD-10-CM | POA: Diagnosis not present

## 2015-09-14 NOTE — Patient Instructions (Signed)
Abdominal Aortic Aneurysm An aneurysm is a weakened or damaged part of an artery wall that bulges from the normal force of blood pumping through the body. An abdominal aortic aneurysm is an aneurysm that occurs in the lower part of the aorta, the main artery of the body.  The major concern with an abdominal aortic aneurysm is that it can enlarge and burst (rupture) or blood can flow between the layers of the wall of the aorta through a tear (aorticdissection). Both of these conditions can cause bleeding inside the body and can be life threatening unless diagnosed and treated promptly. CAUSES  The exact cause of an abdominal aortic aneurysm is unknown. Some contributing factors are:   A hardening of the arteries caused by the buildup of fat and other substances in the lining of a blood vessel (arteriosclerosis).  Inflammation of the walls of an artery (arteritis).   Connective tissue diseases, such as Marfan syndrome.   Abdominal trauma.   An infection, such as syphilis or staphylococcus, in the wall of the aorta (infectious aortitis) caused by bacteria. RISK FACTORS  Risk factors that contribute to an abdominal aortic aneurysm may include:  Age older than 60 years.   High blood pressure (hypertension).  Male gender.  Ethnicity (white race).  Obesity.  Family history of aneurysm (first degree relatives only).  Tobacco use. PREVENTION  The following healthy lifestyle habits may help decrease your risk of abdominal aortic aneurysm:  Quitting smoking. Smoking can raise your blood pressure and cause arteriosclerosis.  Limiting or avoiding alcohol.  Keeping your blood pressure, blood sugar level, and cholesterol levels within normal limits.  Decreasing your salt intake. In somepeople, too much salt can raise blood pressure and increase your risk of abdominal aortic aneurysm.  Eating a diet low in saturated fats and cholesterol.  Increasing your fiber intake by including  whole grains, vegetables, and fruits in your diet. Eating these foods may help lower blood pressure.  Maintaining a healthy weight.  Staying physically active and exercising regularly. SYMPTOMS  The symptoms of abdominal aortic aneurysm may vary depending on the size and rate of growth of the aneurysm.Most grow slowly and do not have any symptoms. When symptoms do occur, they may include:  Pain (abdomen, side, lower back, or groin). The pain may vary in intensity. A sudden onset of severe pain may indicate that the aneurysm has ruptured.  Feeling full after eating only small amounts of food.  Nausea or vomiting or both.  Feeling a pulsating lump in the abdomen.  Feeling faint or passing out. DIAGNOSIS  Since most unruptured abdominal aortic aneurysms have no symptoms, they are often discovered during diagnostic exams for other conditions. An aneurysm may be found during the following procedures:  Ultrasonography (A one-time screening for abdominal aortic aneurysm by ultrasonography is also recommended for all men aged 65-75 years who have ever smoked).  X-ray exams.  A computed tomography (CT).  Magnetic resonance imaging (MRI).  Angiography or arteriography. TREATMENT  Treatment of an abdominal aortic aneurysm depends on the size of your aneurysm, your age, and risk factors for rupture. Medication to control blood pressure and pain may be used to manage aneurysms smaller than 6 cm. Regular monitoring for enlargement may be recommended by your caregiver if:  The aneurysm is 3-4 cm in size (an annual ultrasonography may be recommended).  The aneurysm is 4-4.5 cm in size (an ultrasonography every 6 months may be recommended).  The aneurysm is larger than 4.5 cm in   size (your caregiver may ask that you be examined by a vascular surgeon). If your aneurysm is larger than 6 cm, surgical repair may be recommended. There are two main methods for repair of an aneurysm:   Endovascular  repair (a minimally invasive surgery). This is done most often.  Open repair. This method is used if an endovascular repair is not possible.   This information is not intended to replace advice given to you by your health care provider. Make sure you discuss any questions you have with your health care provider.   Document Released: 03/16/2005 Document Revised: 10/01/2012 Document Reviewed: 07/06/2012 Elsevier Interactive Patient Education 2016 Elsevier Inc.     Smoking Cessation, Tips for Success If you are ready to quit smoking, congratulations! You have chosen to help yourself be healthier. Cigarettes bring nicotine, tar, carbon monoxide, and other irritants into your body. Your lungs, heart, and blood vessels will be able to work better without these poisons. There are many different ways to quit smoking. Nicotine gum, nicotine patches, a nicotine inhaler, or nicotine nasal spray can help with physical craving. Hypnosis, support groups, and medicines help break the habit of smoking. WHAT THINGS CAN I DO TO MAKE QUITTING EASIER?  Here are some tips to help you quit for good:  Pick a date when you will quit smoking completely. Tell all of your friends and family about your plan to quit on that date.  Do not try to slowly cut down on the number of cigarettes you are smoking. Pick a quit date and quit smoking completely starting on that day.  Throw away all cigarettes.   Clean and remove all ashtrays from your home, work, and car.  On a card, write down your reasons for quitting. Carry the card with you and read it when you get the urge to smoke.  Cleanse your body of nicotine. Drink enough water and fluids to keep your urine clear or pale yellow. Do this after quitting to flush the nicotine from your body.  Learn to predict your moods. Do not let a bad situation be your excuse to have a cigarette. Some situations in your life might tempt you into wanting a cigarette.  Never have  "just one" cigarette. It leads to wanting another and another. Remind yourself of your decision to quit.  Change habits associated with smoking. If you smoked while driving or when feeling stressed, try other activities to replace smoking. Stand up when drinking your coffee. Brush your teeth after eating. Sit in a different chair when you read the paper. Avoid alcohol while trying to quit, and try to drink fewer caffeinated beverages. Alcohol and caffeine may urge you to smoke.  Avoid foods and drinks that can trigger a desire to smoke, such as sugary or spicy foods and alcohol.  Ask people who smoke not to smoke around you.  Have something planned to do right after eating or having a cup of coffee. For example, plan to take a walk or exercise.  Try a relaxation exercise to calm you down and decrease your stress. Remember, you may be tense and nervous for the first 2 weeks after you quit, but this will pass.  Find new activities to keep your hands busy. Play with a pen, coin, or rubber band. Doodle or draw things on paper.  Brush your teeth right after eating. This will help cut down on the craving for the taste of tobacco after meals. You can also try mouthwash.   Use   oral substitutes in place of cigarettes. Try using lemon drops, carrots, cinnamon sticks, or chewing gum. Keep them handy so they are available when you have the urge to smoke.  When you have the urge to smoke, try deep breathing.  Designate your home as a nonsmoking area.  If you are a heavy smoker, ask your health care provider about a prescription for nicotine chewing gum. It can ease your withdrawal from nicotine.  Reward yourself. Set aside the cigarette money you save and buy yourself something nice.  Look for support from others. Join a support group or smoking cessation program. Ask someone at home or at work to help you with your plan to quit smoking.  Always ask yourself, "Do I need this cigarette or is this just  a reflex?" Tell yourself, "Today, I choose not to smoke," or "I do not want to smoke." You are reminding yourself of your decision to quit.  Do not replace cigarette smoking with electronic cigarettes (commonly called e-cigarettes). The safety of e-cigarettes is unknown, and some may contain harmful chemicals.  If you relapse, do not give up! Plan ahead and think about what you will do the next time you get the urge to smoke. HOW WILL I FEEL WHEN I QUIT SMOKING? You may have symptoms of withdrawal because your body is used to nicotine (the addictive substance in cigarettes). You may crave cigarettes, be irritable, feel very hungry, cough often, get headaches, or have difficulty concentrating. The withdrawal symptoms are only temporary. They are strongest when you first quit but will go away within 10-14 days. When withdrawal symptoms occur, stay in control. Think about your reasons for quitting. Remind yourself that these are signs that your body is healing and getting used to being without cigarettes. Remember that withdrawal symptoms are easier to treat than the major diseases that smoking can cause.  Even after the withdrawal is over, expect periodic urges to smoke. However, these cravings are generally short lived and will go away whether you smoke or not. Do not smoke! WHAT RESOURCES ARE AVAILABLE TO HELP ME QUIT SMOKING? Your health care provider can direct you to community resources or hospitals for support, which may include:  Group support.  Education.  Hypnosis.  Therapy.   This information is not intended to replace advice given to you by your health care provider. Make sure you discuss any questions you have with your health care provider.   Document Released: 03/04/2004 Document Revised: 06/27/2014 Document Reviewed: 11/22/2012 Elsevier Interactive Patient Education 2016 Elsevier Inc.    Steps to Quit Smoking  Smoking tobacco can be harmful to your health and can affect  almost every organ in your body. Smoking puts you, and those around you, at risk for developing many serious chronic diseases. Quitting smoking is difficult, but it is one of the best things that you can do for your health. It is never too late to quit. WHAT ARE THE BENEFITS OF QUITTING SMOKING? When you quit smoking, you lower your risk of developing serious diseases and conditions, such as:  Lung cancer or lung disease, such as COPD.  Heart disease.  Stroke.  Heart attack.  Infertility.  Osteoporosis and bone fractures. Additionally, symptoms such as coughing, wheezing, and shortness of breath may get better when you quit. You may also find that you get sick less often because your body is stronger at fighting off colds and infections. If you are pregnant, quitting smoking can help to reduce your chances of having   a baby of low birth weight. HOW DO I GET READY TO QUIT? When you decide to quit smoking, create a plan to make sure that you are successful. Before you quit:  Pick a date to quit. Set a date within the next two weeks to give you time to prepare.  Write down the reasons why you are quitting. Keep this list in places where you will see it often, such as on your bathroom mirror or in your car or wallet.  Identify the people, places, things, and activities that make you want to smoke (triggers) and avoid them. Make sure to take these actions:  Throw away all cigarettes at home, at work, and in your car.  Throw away smoking accessories, such as ashtrays and lighters.  Clean your car and make sure to empty the ashtray.  Clean your home, including curtains and carpets.  Tell your family, friends, and coworkers that you are quitting. Support from your loved ones can make quitting easier.  Talk with your health care provider about your options for quitting smoking.  Find out what treatment options are covered by your health insurance. WHAT STRATEGIES CAN I USE TO QUIT  SMOKING?  Talk with your healthcare provider about different strategies to quit smoking. Some strategies include:  Quitting smoking altogether instead of gradually lessening how much you smoke over a period of time. Research shows that quitting "cold turkey" is more successful than gradually quitting.  Attending in-person counseling to help you build problem-solving skills. You are more likely to have success in quitting if you attend several counseling sessions. Even short sessions of 10 minutes can be effective.  Finding resources and support systems that can help you to quit smoking and remain smoke-free after you quit. These resources are most helpful when you use them often. They can include:  Online chats with a counselor.  Telephone quitlines.  Printed self-help materials.  Support groups or group counseling.  Text messaging programs.  Mobile phone applications.  Taking medicines to help you quit smoking. (If you are pregnant or breastfeeding, talk with your health care provider first.) Some medicines contain nicotine and some do not. Both types of medicines help with cravings, but the medicines that include nicotine help to relieve withdrawal symptoms. Your health care provider may recommend:  Nicotine patches, gum, or lozenges.  Nicotine inhalers or sprays.  Non-nicotine medicine that is taken by mouth. Talk with your health care provider about combining strategies, such as taking medicines while you are also receiving in-person counseling. Using these two strategies together makes you more likely to succeed in quitting than if you used either strategy on its own. If you are pregnant or breastfeeding, talk with your health care provider about finding counseling or other support strategies to quit smoking. Do not take medicine to help you quit smoking unless told to do so by your health care provider. WHAT THINGS CAN I DO TO MAKE IT EASIER TO QUIT? Quitting smoking might feel  overwhelming at first, but there is a lot that you can do to make it easier. Take these important actions:  Reach out to your family and friends and ask that they support and encourage you during this time. Call telephone quitlines, reach out to support groups, or work with a counselor for support.  Ask people who smoke to avoid smoking around you.  Avoid places that trigger you to smoke, such as bars, parties, or smoke-break areas at work.  Spend time around people who do   not smoke.  Lessen stress in your life, because stress can be a smoking trigger for some people. To lessen stress, try:  Exercising regularly.  Deep-breathing exercises.  Yoga.  Meditating.  Performing a body scan. This involves closing your eyes, scanning your body from head to toe, and noticing which parts of your body are particularly tense. Purposefully relax the muscles in those areas.  Download or purchase mobile phone or tablet apps (applications) that can help you stick to your quit plan by providing reminders, tips, and encouragement. There are many free apps, such as QuitGuide from the CDC (Centers for Disease Control and Prevention). You can find other support for quitting smoking (smoking cessation) through smokefree.gov and other websites. HOW WILL I FEEL WHEN I QUIT SMOKING? Within the first 24 hours of quitting smoking, you may start to feel some withdrawal symptoms. These symptoms are usually most noticeable 2-3 days after quitting, but they usually do not last beyond 2-3 weeks. Changes or symptoms that you might experience include:  Mood swings.  Restlessness, anxiety, or irritation.  Difficulty concentrating.  Dizziness.  Strong cravings for sugary foods in addition to nicotine.  Mild weight gain.  Constipation.  Nausea.  Coughing or a sore throat.  Changes in how your medicines work in your body.  A depressed mood.  Difficulty sleeping (insomnia). After the first 2-3 weeks of  quitting, you may start to notice more positive results, such as:  Improved sense of smell and taste.  Decreased coughing and sore throat.  Slower heart rate.  Lower blood pressure.  Clearer skin.  The ability to breathe more easily.  Fewer sick days. Quitting smoking is very challenging for most people. Do not get discouraged if you are not successful the first time. Some people need to make many attempts to quit before they achieve long-term success. Do your best to stick to your quit plan, and talk with your health care provider if you have any questions or concerns.   This information is not intended to replace advice given to you by your health care provider. Make sure you discuss any questions you have with your health care provider.   Document Released: 05/31/2001 Document Revised: 10/21/2014 Document Reviewed: 10/21/2014 Elsevier Interactive Patient Education 2016 Elsevier Inc.  

## 2015-09-14 NOTE — Progress Notes (Signed)
[facility] [place]  Established Abdominal Aortic Aneurysm  History of Present Illness  Troy [name] is a 75 y.o. (13-Oct-1940) male patient of Dr. Bridgett Larsson who presents with chief complaint: follow up for AAA. Previous studies demonstrate an AAA, measuring 4.59 cm. The patient does not have abdominal pain. The patient is a smoker.  He does not have any abdominal or back pain referable to AAA. ahe does have intermittent low back pain found to be related to arthritis.  He had TIA's years ago, none recently.  He denies claudication symptoms in legs, denies non healing wounds.  He had a CABG X 5 vessels in 2012, denies hx of MI.   His taking iron pills for anemia. He has lumabr spine surgery, Dr. 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  Pt Diabetic: No  Pt smoker: smokes 5 cigarettes/day, decreased from 8 at previous visit, was up to 3 ppd, started at age 51    Past Medical History  Diagnosis Date  . COPD (chronic obstructive pulmonary disease) ([place])   . Hypertension   . Hyperlipidemia   . Ulcer   . Reflux   . AAA (abdominal aortic aneurysm) ([place])   . CAD (coronary artery disease)     cath 11.30.2012 - 3vd  . Bilateral cataracts   . Retinal detachment     history of right detachment  . Angina   . Lung nodule     SPOT ON LUNG NOT DIAGNOSTED AS CANCER KEEPING AN EYE ON IT  . CHF (congestive heart failure) ([place])   . GERD (gastroesophageal reflux disease)   . Hiatal hernia   . Depression   . Status post coronary artery bypass grafting     surgery May 24, 2011 by Dr. Roxan Hockey.  [name] Postoperative atrial fibrillation (HCC)     amiodarone therapy  . Back fracture   . 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 Lung surgery  1964  . Nose surgery    . Coronary angioplasty  1992    Stent  . Eye surgery      [name] DETACHED HAD SURGERY FOR IT  .  Coronary artery bypass graft  05/24/2011    Procedure: CORONARY ARTERY BYPASS GRAFTING (CABG);  Surgeon: Melrose Nakayama, MD;  Location: [facility];  Service: Open Heart Surgery;  Laterality: N/A;  Coronary Artery Bypass Graft on pump times five, utlizing left internal mammary artery and right saphenous vein harvested endoscopically   . Lesion removal N/A 07/29/2013    Procedure: EXCISION OF MALIGNANT SKIN LESION BACK;  Surgeon: Jamesetta So, MD;  Location: [facility];  Service: General;  Laterality: N/A;  . Spine surgery  July 2016    3rd Vert.   Social History Social History   Social History  . Marital Status: Married    Spouse Name: N/A  . Number of Children: N/A  . Years of Education: N/A   Occupational History  . Not on file.   Social History Main Topics  . Smoking status: Light Tobacco Smoker -- 1.00 packs/day for 56 years    Types: Cigarettes    Start date: 02/15/1956  . Smokeless tobacco: Never Used  . Alcohol Use: 4.2 oz/week    7 Shots of liquor per week  . Drug Use: No  . Sexual Activity: Yes   Other Topics Concern  . Not  on file   Social History Narrative   Family History Family History  Problem Relation Age of Onset  . Heart disease Father     Before age 43-Heart Attack  . Heart attack Father   . Heart disease Sister   . Heart disease Brother   . Heart attack Brother     Current Outpatient Prescriptions on File Prior to Visit  Medication Sig Dispense Refill  . aspirin 81 MG tablet Take 81 mg by mouth daily.     [name] atenolol (TENORMIN) 50 MG tablet Take 50 mg by mouth daily.      . calcium carbonate (OS-CAL) 600 MG TABS Take 600 mg by mouth 2 (two) times daily.    . citalopram (CELEXA) 20 MG tablet Take 20 mg by mouth daily.      . Cyanocobalamin (VITAMIN B 12 PO) Take 1 tablet by mouth every other day.    . lovastatin (MEVACOR) 20 MG tablet Take 1 tablet by mouth daily.    . Multiple Vitamins-Minerals (CENTRUM SILVER PO) Take 1 tablet by mouth daily.    .  nitroGLYCERIN (NITROSTAT) 0.4 MG SL tablet Place 1 tablet (0.4 mg total) under the tongue every 5 (five) minutes as needed for chest pain. 25 tablet 3  . ranolazine (RANEXA) 500 MG 12 hr tablet Take 1 tablet (500 mg total) by mouth 2 (two) times daily. 180 tablet 3  . SYMBICORT 160-4.5 MCG/ACT inhaler 1 puff 2 (two) times daily.      No current facility-administered medications on file prior to visit.   Allergies  Allergen Reactions  . Isosorbide Mononitrate     SEVER HEAD ACH ; PT COULD NOT SLEEP FOR 2 DAYS  . Penicillins Rash  . Ambien [Zolpidem Tartrate]     hallucination       ROS: See HPI for pertinent positives and negatives.  Physical Examination  Filed Vitals:   09/14/15 0846  BP: 118/80  Pulse: 67  Temp: 97.2 F (36.2 C)  TempSrc: Oral  Resp: 18  Height: 5\' 11"  (1.803 m)  Weight: 164 lb 1.6 oz (74.435 kg)  SpO2: 100%   Body mass index is 22.9 kg/(m^2).  General: A&O x 3, WD.  Pulmonary: Sym exp, limited air movt, CTAB, no rales, rhonchi, + transient wheezing.  Cardiac: RRR, Nl S1, S2, no detected murmur.   Carotid Bruits  Left  Right    Negative  Negative   Aorta is not palpable  Radial pulses are 1+ palpable and =  VASCULAR EXAM:  LE Pulses  LEFT  RIGHT   FEMORAL  2+palpable  2+palpable   POPLITEAL  not palpable  not palpable   POSTERIOR TIBIAL  not palpable  Not palpable   DORSALIS PEDIS  ANTERIOR TIBIAL  not palpable  not palpable    Gastrointestinal: soft, NTND, -G/R, - HSM, - palpable masses, - CVAT B.  Musculoskeletal: M/S 5/5 throughout , Extremities without ischemic changes. Feet are pink, toes of both feet with brisk capillary refill.  Neurologic: CN 2-12 intact, Pain and light touch intact in extremities are intact, Motor exam as listed above                      Non-Invasive Vascular Imaging  AAA Duplex (09/14/2015)  Previous size: 4.7 cm (Date:  02/16/15)  Current size:  4.59 cm (Date: 09/14/2015) Dilated right iliac artery measuring 2.1 cm, previous was 2.0 cm  Medical Decision Making  The patient was counseled re smoking  cessation and given several free resources re smoking cessation.  The patient is a 75 y.o. male who presents with asymptomatic AAA with no increase in size.   Based on this patient's exam and diagnostic studies, the patient will follow up in 6 months  with the following studies: AAA duplex.  Consideration for repair of AAA would be made when the size is 5.5 cm, growth > 1 cm/yr, and symptomatic status.  I emphasized the importance of maximal medical management including strict control of blood pressure, blood glucose, and lipid levels, antiplatelet agents, obtaining regular exercise, and cessation of smoking.   The patient was given information about AAA including signs, symptoms, treatment, and how to minimize the risk of enlargement and rupture of aneurysms.    The patient was advised to call 911 should the patient experience sudden onset abdominal or back pain.   Thank you for allowing [place] to participate in this patient's care.  Clemon Chambers, RN, MSN, FNP-C [facility] Office: [facility] Physician: Trula Slade  09/14/2015, 8:49 AM

## 2015-12-19 DEATH — deceased

## 2016-03-18 ENCOUNTER — Other Ambulatory Visit (HOSPITAL_COMMUNITY): Payer: Medicare Other

## 2016-03-18 ENCOUNTER — Ambulatory Visit: Payer: Medicare Other | Admitting: Family
